# Patient Record
Sex: Female | Born: 1990 | Race: Black or African American | Hispanic: No | Marital: Single | Smoking: Never smoker
Health system: Southern US, Community
[De-identification: ages and names within clinical notes are randomized; demographics above are authoritative.]

## PROBLEM LIST (undated history)

## (undated) DIAGNOSIS — Z789 Other specified health status: Secondary | ICD-10-CM

## (undated) DIAGNOSIS — D649 Anemia, unspecified: Secondary | ICD-10-CM

## (undated) DIAGNOSIS — Z8759 Personal history of other complications of pregnancy, childbirth and the puerperium: Secondary | ICD-10-CM

## (undated) DIAGNOSIS — N883 Incompetence of cervix uteri: Secondary | ICD-10-CM

## (undated) HISTORY — DX: Anemia, unspecified: D64.9

## (undated) HISTORY — PX: TONSILLECTOMY: SUR1361

## (undated) HISTORY — PX: OTHER SURGICAL HISTORY: SHX169

## (undated) HISTORY — PX: NO PAST SURGERIES: SHX2092

---

## 2016-01-28 ENCOUNTER — Encounter (HOSPITAL_COMMUNITY): Payer: Self-pay

## 2016-01-28 ENCOUNTER — Inpatient Hospital Stay (HOSPITAL_COMMUNITY)
Admission: AD | Admit: 2016-01-28 | Discharge: 2016-01-28 | Disposition: A | Discharge status: Home / Self Care | Payer: Self-pay | Source: Ambulatory Visit | Attending: Family Medicine | Admitting: Family Medicine

## 2016-01-28 DIAGNOSIS — N920 Excessive and frequent menstruation with regular cycle: Secondary | ICD-10-CM | POA: Insufficient documentation

## 2016-01-28 HISTORY — DX: Other specified health status: Z78.9

## 2016-01-28 LAB — URINE MICROSCOPIC-ADD ON: Bacteria, UA: NONE SEEN

## 2016-01-28 LAB — CBC
HEMATOCRIT: 33.6 % — AB (ref 36.0–46.0)
HEMOGLOBIN: 11.6 g/dL — AB (ref 12.0–15.0)
MCH: 28.8 pg (ref 26.0–34.0)
MCHC: 34.5 g/dL (ref 30.0–36.0)
MCV: 83.4 fL (ref 78.0–100.0)
Platelets: 261 10*3/uL (ref 150–400)
RBC: 4.03 MIL/uL (ref 3.87–5.11)
RDW: 12.3 % (ref 11.5–15.5)
WBC: 8.6 10*3/uL (ref 4.0–10.5)

## 2016-01-28 LAB — URINALYSIS, ROUTINE W REFLEX MICROSCOPIC
Bilirubin Urine: NEGATIVE
GLUCOSE, UA: NEGATIVE mg/dL
Ketones, ur: NEGATIVE mg/dL
Nitrite: NEGATIVE
PH: 6.5 (ref 5.0–8.0)
PROTEIN: NEGATIVE mg/dL
Specific Gravity, Urine: 1.01 (ref 1.005–1.030)

## 2016-01-28 LAB — POCT PREGNANCY, URINE: Preg Test, Ur: NEGATIVE

## 2016-01-28 MED ORDER — FERROUS SULFATE 324 (65 FE) MG PO TBEC: 1.0000 | DELAYED_RELEASE_TABLET | Freq: Two times a day (BID) | ORAL | 3 refills | Status: DC

## 2016-01-28 NOTE — MAU Note (Signed)
Pt states she is having her period, passed large clot yesterday, has changed 3 pads today.  Stopped depo 6-8 months ago.  Had sharp pain in lower abd earlier today, none now.  Also had back pain earlier but not now.

## 2016-01-28 NOTE — MAU Provider Note (Signed)
History     CSN: [id number]  Arrival date and time: 01/28/16 1545   First Provider Initiated Contact with Patient 01/28/16 1727      Chief Complaint  Patient presents with  . Vaginal Bleeding   HPI  Patient is a 25 year old female who presents today for evaluation of heavy periods. Patient reports she was on Depo-Provera until about 1 year ago. Ever since she's had significantly heavy periods with passage of clots. She presents today could she was concerned however passing approximately a baseball size clot yesterday. Since since then she is only passed very small clots. She does report lightheadedness and dizziness especially with changes in position but this is been going on for some time for her. Additionally she does endorse some mild palpitations. She is sexually active with her boyfriend in a monogamous relationship and they do not regularly use protection or birth control of any type.    Past Medical History:  Diagnosis Date  . Medical history non-contributory     Past Surgical History:  Procedure Laterality Date  . NO PAST SURGERIES      Family History  Problem Relation Age of Onset  . Diabetes Mother     Social History  Substance Use Topics  . Smoking status: Never Smoker  . Smokeless tobacco: Never Used  . Alcohol use Yes     Comment: social    Allergies: No Known Allergies  No prescriptions prior to admission.    Review of Systems  Constitutional: Negative for chills, fever and weight loss.  HENT: Negative for congestion.   Eyes: Negative for blurred vision and double vision.  Respiratory: Negative for cough and hemoptysis.   Cardiovascular: Positive for palpitations. Negative for chest pain.  Gastrointestinal: Positive for abdominal pain. Negative for heartburn, nausea and vomiting.  Genitourinary: Negative for dysuria, frequency and urgency.  Neurological: Positive for dizziness. Negative for sensory change, loss of consciousness and headaches.   Endo/Heme/Allergies: Negative for environmental allergies. Does not bruise/bleed easily.   Physical Exam   Blood pressure 119/81, pulse 104, temperature 98.4 F (36.9 C), temperature source Oral, resp. rate 18, height 5\' 2"  (1.575 m), weight 118 lb (53.5 kg), last menstrual period 01/25/2016.  Physical Exam  Constitutional: She is oriented to person, place, and time. She appears well-developed and well-nourished.  HENT:  Head: Normocephalic and atraumatic.  Cardiovascular: Normal rate, regular rhythm and normal heart sounds.   Respiratory: Effort normal and breath sounds normal. No respiratory distress.  GI: Soft. Bowel sounds are normal. She exhibits no distension. There is no tenderness.  Genitourinary:  Genitourinary Comments: Minimal bright red blood in the vaginal vault, no cervical motion tenderness, cervix closed visually  Neurological: She is alert and oriented to person, place, and time.  Skin: Skin is warm and dry.  Psychiatric: She has a normal mood and affect. Her behavior is normal.    MAU Course  Procedures  MDM  In the MAU patient underwent pelvic exam which is unremarkable aside from some scant blood, additionally patient had a CBC drawn which revealed minimal anemia. We will prescribe a iron tablet. Otherwise patient was counseled that likely this is just a heavy. And the best way to control this would be birth control which she declines at this time. He appears clinically stable at this time with no hypo-tension or tachycardia  Assessment and Plan  Menorrhagia: Patient with heavy periods. Will supplement with iron is patient is becoming slightly anemic and slightly microcytic. Patient does not wish to  start birth control to control her periods at this time. Patient is reassured and ready for discharge.  Ernestina Pennaicholas Ijeoma Loor 01/28/2016, 5:27 PM

## 2016-01-28 NOTE — Discharge Instructions (Signed)

## 2016-01-29 LAB — CULTURE, OB URINE

## 2017-01-13 ENCOUNTER — Encounter: Payer: Self-pay | Admitting: Obstetrics and Gynecology

## 2017-01-13 ENCOUNTER — Ambulatory Visit (INDEPENDENT_AMBULATORY_CARE_PROVIDER_SITE_OTHER): Payer: BLUE CROSS/BLUE SHIELD | Admitting: Obstetrics and Gynecology

## 2017-01-13 VITALS — BP 114/70 | Ht 61.0 in | Wt 123.0 lb

## 2017-01-13 DIAGNOSIS — Z1389 Encounter for screening for other disorder: Secondary | ICD-10-CM | POA: Diagnosis not present

## 2017-01-13 DIAGNOSIS — Z1339 Encounter for screening examination for other mental health and behavioral disorders: Secondary | ICD-10-CM

## 2017-01-13 DIAGNOSIS — Z01419 Encounter for gynecological examination (general) (routine) without abnormal findings: Secondary | ICD-10-CM | POA: Diagnosis not present

## 2017-01-13 DIAGNOSIS — Z1331 Encounter for screening for depression: Secondary | ICD-10-CM

## 2017-01-13 DIAGNOSIS — Z124 Encounter for screening for malignant neoplasm of cervix: Secondary | ICD-10-CM

## 2017-01-13 DIAGNOSIS — Z113 Encounter for screening for infections with a predominantly sexual mode of transmission: Secondary | ICD-10-CM

## 2017-01-13 DIAGNOSIS — R875 Abnormal microbiological findings in specimens from female genital organs: Secondary | ICD-10-CM | POA: Diagnosis not present

## 2017-01-13 NOTE — Progress Notes (Signed)
Gynecology Annual Exam  PCP: Patient, No Pcp Per  Chief Complaint  Patient presents with  . Annual Exam    History of Present Illness:  [name] is a 26 y.o. G0P0000 who LMP was Patient's last menstrual period was 01/04/2017., presents today for her annual examination.  Her menses are regular every 28-30 days, lasting 5 day(s).  Dysmenorrhea none. She does not have intermenstrual bleeding.  She is single partner, contraception - none.  Last Pap: 2 years  Results were: no abnormalities /neg HPV DNA not done Hx of STDs: none  There is no FH of breast cancer. There is no FH of ovarian cancer. The patient does not do self-breast exams.  Tobacco use: The patient denies current or previous tobacco use. Alcohol use: social drinker Exercise: moderately active  The patient wears seatbelts: yes.   The patient reports that domestic violence in her life is absent.   Past Medical History:  Diagnosis Date  . Medical history non-contributory     Past Surgical History:  Procedure Laterality Date  . NO PAST SURGERIES      Prior to Admission medications   Medication Sig Start Date End Date Taking? Authorizing Provider  ferrous sulfate 324 (65 Fe) MG TBEC Take 1 tablet (325 mg total) by mouth 2 (two) times daily. 01/28/16  Yes Lorne SkeensSchenk, Nicholas Michael, MD   Allergies: No Known Allergies  Gynecologic History: Patient's last menstrual period was 01/04/2017. History of abnormal pap smear: No History of STI: No   Obstetric History: G0P0000  Social History   Social History  . Marital status: Single    Spouse name: N/A  . Number of children: N/A  . Years of education: N/A   Occupational History  . Not on file.   Social History Main Topics  . Smoking status: Never Smoker  . Smokeless tobacco: Never Used  . Alcohol use Yes     Comment: social  . Drug use: No  . Sexual activity: Yes    Birth control/ protection: None   Other Topics Concern  . Not on file   Social  History Narrative  . No narrative on file    Family History  Problem Relation Age of Onset  . Diabetes Mother     ROS   Physical Exam BP 114/70   Ht 5\' 1"  (1.549 m)   Wt 123 lb (55.8 kg)   LMP 01/04/2017   BMI 23.24 kg/m    OBGyn Exam  Female chaperone present for pelvic and breast  portions of the physical exam  Results: AUDIT Questionnaire (screen for alcoholism): 2 PHQ-9: 2   Assessment: 26 y.o. G0P0000 female here for routine annual gynecologic examination  Plan: Problem List Items Addressed This Visit    None    Visit Diagnoses    Women's annual routine gynecological examination    -  Primary   Relevant Orders   IGP,CtNgTv,rfx Aptima HPV ASCU   Pap smear for cervical cancer screening       Relevant Orders   IGP,CtNgTv,rfx Aptima HPV ASCU   Screen for STD (sexually transmitted disease)       Relevant Orders   IGP,CtNgTv,rfx Aptima HPV ASCU   Screening for depression       Screening for alcohol problem          Screening: -- Blood pressure screen normal -- Weight screening: normal -- Depression screening negative (PHQ-9) -- Nutrition: normal -- cholesterol screening: not due for screening -- osteoporosis screening: not due --  tobacco screening: not using -- alcohol screening: AUDIT questionnaire indicates low-risk usage. -- family history of breast cancer screening: done. not at high risk. -- no evidence of domestic violence or intimate partner violence. -- STD screening: gonorrhea/chlamydia NAAT collected -- pap smear collected per ASCCP guidelines  [name], MD 01/13/2017 3:59 PM

## 2017-01-26 LAB — IGP, CTNG, RFX APTIMA HPV ASCU
CHLAMYDIA, NUC. ACID AMP: NEGATIVE
Gonococcus by Nucleic Acid Amp: NEGATIVE
PAP SMEAR COMMENT: 0

## 2017-02-06 ENCOUNTER — Telehealth: Payer: Self-pay

## 2017-02-06 DIAGNOSIS — A5901 Trichomonal vulvovaginitis: Secondary | ICD-10-CM

## 2017-02-06 NOTE — Telephone Encounter (Signed)
Pt aware and will p/u RX when sent in

## 2017-02-06 NOTE — Telephone Encounter (Signed)
-----   Message from Conard NovakStephen D Jackson, MD sent at 02/05/2017 10:25 AM EDT ----- Franchot MimesHi Beverly,  Would you mind letting this patient know that her pap smear came back as normal. However, it showed trichomonas, even though we weren't looking for that.   Once you let her know, I will call in her prescription for trichomonas.  Be sure to let her know that her partner needs to be treated, as well.  If you have any questions, let me know.  Please send this back to me once you've talked to her so that I can go ahead and send in her prescription. Thank you!

## 2017-02-06 NOTE — Telephone Encounter (Signed)
Pt is calling about her prescription. Pt states isn't at pharmacy. Please advise and call patient.

## 2017-02-07 MED ORDER — METRONIDAZOLE 500 MG PO TABS: 2000.0000 mg | ORAL_TABLET | Freq: Once | ORAL | 0 refills | Status: AC

## 2017-02-07 NOTE — Telephone Encounter (Signed)
[facility] [address]

## 2017-02-17 ENCOUNTER — Telehealth: Payer: Self-pay

## 2017-02-17 NOTE — Telephone Encounter (Signed)
Pt states she needs another RX sent in for treatment of trich to give to her partner as he does not have a doctor. fwding to [facility]. I will let pt know when sent.

## 2017-02-17 NOTE — Telephone Encounter (Signed)
Patient calling back due to find out about her prescription please advise

## 2017-02-18 ENCOUNTER — Other Ambulatory Visit: Payer: Self-pay

## 2017-02-18 ENCOUNTER — Telehealth: Payer: Self-pay | Admitting: Obstetrics and Gynecology

## 2017-02-18 MED ORDER — FERROUS SULFATE 324 (65 FE) MG PO TBEC: 1.0000 | DELAYED_RELEASE_TABLET | Freq: Two times a day (BID) | ORAL | 3 refills | Status: DC

## 2017-02-18 NOTE — Telephone Encounter (Signed)
Refill sent in to pharm. Tried to call pt with no answer. Please let her know when she calls back

## 2017-02-18 NOTE — Telephone Encounter (Signed)
You may send her in another prescription for flagyl 500 mg tabs. Take 4 tablets (total of 2,000 mg) at one time. No refills.

## 2017-02-18 NOTE — Telephone Encounter (Signed)
See previous task. Assigned to [facility].

## 2017-02-18 NOTE — Telephone Encounter (Signed)
Pt is requesting an Rx for  metroNIDAZOLE (FLAGYL) 500 MG tablet to be sent [facility] [address] for her partner b/c he doesn't have a PCP. Pt stated that Dr. Jean RosenthalJackson told her at her last OV that if her partner needed the medication to call the office and he would send in a Rx. Pt stated she has been requesting this since 02/17/17. Please advise. Thanks TNP

## 2017-02-19 ENCOUNTER — Encounter: Payer: Self-pay | Admitting: Obstetrics and Gynecology

## 2017-03-24 ENCOUNTER — Ambulatory Visit (INDEPENDENT_AMBULATORY_CARE_PROVIDER_SITE_OTHER): Payer: BLUE CROSS/BLUE SHIELD | Admitting: Maternal Newborn

## 2017-03-24 ENCOUNTER — Encounter: Payer: Self-pay | Admitting: Maternal Newborn

## 2017-03-24 VITALS — BP 114/70 | Wt 129.0 lb

## 2017-03-24 DIAGNOSIS — Z3401 Encounter for supervision of normal first pregnancy, first trimester: Secondary | ICD-10-CM | POA: Diagnosis not present

## 2017-03-24 NOTE — Progress Notes (Addendum)
03/25/2017   Chief Complaint: Amenorrhea, positive home pregnancy test, desires prenatal care.  Transfer of Care Patient: no  History of Present Illness: Michelle Miller is a 26 y.o. G1P0000 at 5w 1d based on last menstrual period on 02/17/2017, with an Estimated Date of Delivery: 11/24/2017, with the above CC.   Her periods were: regular periods every 28 days, lasting 5-6 days. Her cycle in September was 3-4 days. She was using no method when she conceived.  She has Positive signs or symptoms of fatigue and breast tenderness She has Negative signs or symptoms of miscarriage or preterm labor She identifies Negative Zika risk factors for her and her partner On any different medications around the time she conceived/early pregnancy: No  History of varicella: No   ROS: A 12-point review of systems was performed and negative, except as stated in the above HPI.  OBGYN History: As per HPI. OB History  Gravida Para Term Preterm AB Living  1 0 0 0 0 0  SAB TAB Ectopic Multiple Live Births  0 0 0 0 0    # Outcome Date GA Lbr Len/2nd Weight Sex Delivery Anes PTL Lv  1 Current               Any issues with any prior pregnancies: not applicable Any prior children are healthy, doing well, without any problems or issues: not applicable History of pap smears: 01/13/2017. Result: NIL. History of STIs: Trichomoniasis in 01/2017, treated.   Past Medical History: Past Medical History:  Diagnosis Date  . Medical history non-contributory     Past Surgical History: Past Surgical History:  Procedure Laterality Date  . NO PAST SURGERIES      Family History:  Family History  Problem Relation Age of Onset  . Diabetes Mother    She denies any female cancers, bleeding or blood clotting disorders.  She denies any history of intellectual disability, birth defects or genetic disorders in her or the FOB's history  Social History:  Social History   Social History  . Marital status: Single   Spouse name: N/A  . Number of children: N/A  . Years of education: N/A   Occupational History  . Not on file.   Social History Main Topics  . Smoking status: Never Smoker  . Smokeless tobacco: Never Used  . Alcohol use Yes     Comment: social  . Drug use: No  . Sexual activity: Yes    Birth control/ protection: None   Other Topics Concern  . Not on file   Social History Narrative  . No narrative on file   Any cats in the household: no Denies history of and current domestic violence.  Allergy: No Known Allergies  Current Outpatient Medications:  Current Outpatient Prescriptions:  .  ferrous sulfate 324 (65 Fe) MG TBEC, Take 1 tablet (325 mg total) by mouth 2 (two) times daily. (Patient not taking: Reported on 03/24/2017), Disp: 30 tablet, Rfl: 3   Physical Exam:   BP 114/70   Wt 129 lb (58.5 kg)   LMP 02/17/2017   BMI 24.37 kg/m  Body mass index is 24.37 kg/m. Constitutional: Well nourished, well developed female in no acute distress.  Neck:  Supple, normal appearance, and no thyromegaly  Cardiovascular: S1, S2 normal, no murmur, rub or gallop, regular rate and rhythm Respiratory:  Clear to auscultation bilateral. Normal respiratory effort Abdomen: positive bowel sounds and no masses, hernias; diffusely non tender to palpation, non distended Breasts: breasts appear normal,  no suspicious masses, no skin or nipple changes or axillary nodes. Neuro/Psych:  Normal mood and affect.  Skin:  Warm and dry.  Lymphatic:  No inguinal lymphadenopathy.   Pelvic exam: is limited by body habitus External genitalia, Bartholin's glands, Urethra, Skene's glands: within normal limits Vagina: within normal limits and with no blood in the vault  Cervix: normal appearing cervix without discharge or lesions, closed/long/high Uterus:  enlarged: consistent with early pregnancy Adnexa:  no mass, fullness, tenderness  Assessment: Ms. Degraffenreid is a 26 y.o. G1P0000 Unknown based on  Patient's last menstrual period was 02/17/2017. with an Estimated Date of Delivery: None noted., presenting for prenatal care.  Plan:  1) Avoid alcoholic beverages. 2) Patient encouraged not to smoke.  3) Discontinue the use of all non-medicinal drugs and chemicals.  4) Take prenatal vitamins daily.  5) Seatbelt use advised 6) Nutrition, food safety (fish, cheese advisories, and high nitrite foods) and exercise discussed. 7) Hospital and practice style delivering at [facility] discussed  8) Patient is asked about travel to areas at risk for the Zika virus, and counseled to avoid travel and exposure to mosquitoes or sexual partners who may have themselves been exposed to the virus. Testing is discussed, and will be ordered as appropriate.  9) Childbirth classes at [facility] advised 10) Genetic Screening, such as with 1st Trimester Screening, cell free fetal DNA, AFP testing, and Ultrasound, as well as with amniocentesis and CVS as appropriate, is discussed with patient. She plans to have genetic testing this pregnancy.  Problem list reviewed and updated.  Return in about 1 day (around 03/25/2017) for ROB following ultrasound.  Marcelyn Bruins, CNM [facility], [facility] 03/25/2017  5:26 PM

## 2017-03-24 NOTE — Patient Instructions (Signed)
First Trimester of Pregnancy The first trimester of pregnancy is from week 1 until the end of week 13 (months 1 through 3). A week after a sperm fertilizes an egg, the egg will implant on the wall of the uterus. This embryo will begin to develop into a baby. Genes from you and your partner will form the baby. The female genes will determine whether the baby will be a boy or a girl. At 6-8 weeks, the eyes and face will be formed, and the heartbeat can be seen on ultrasound. At the end of 12 weeks, all the baby's organs will be formed. Now that you are pregnant, you will want to do everything you can to have a healthy baby. Two of the most important things are to get good prenatal care and to follow your health care provider's instructions. Prenatal care is all the medical care you receive before the baby's birth. This care will help prevent, find, and treat any problems during the pregnancy and childbirth. Body changes during your first trimester Your body goes through many changes during pregnancy. The changes vary from woman to woman.  You may gain or lose a couple of pounds at first.  You may feel sick to your stomach (nauseous) and you may throw up (vomit). If the vomiting is uncontrollable, call your health care provider.  You may tire easily.  You may develop headaches that can be relieved by medicines. All medicines should be approved by your health care provider.  You may urinate more often. Painful urination may mean you have a bladder infection.  You may develop heartburn as a result of your pregnancy.  You may develop constipation because certain hormones are causing the muscles that push stool through your intestines to slow down.  You may develop hemorrhoids or swollen veins (varicose veins).  Your breasts may begin to grow larger and become tender. Your nipples may stick out more, and the tissue that surrounds them (areola) may become darker.  Your gums may bleed and may be  sensitive to brushing and flossing.  Dark spots or blotches (chloasma, mask of pregnancy) may develop on your face. This will likely fade after the baby is born.  Your menstrual periods will stop.  You may have a loss of appetite.  You may develop cravings for certain kinds of food.  You may have changes in your emotions from day to day, such as being excited to be pregnant or being concerned that something may go wrong with the pregnancy and baby.  You may have more vivid and strange dreams.  You may have changes in your hair. These can include thickening of your hair, rapid growth, and changes in texture. Some women also have hair loss during or after pregnancy, or hair that feels dry or thin. Your hair will most likely return to normal after your baby is born.  What to expect at prenatal visits During a routine prenatal visit:  You will be weighed to make sure you and the baby are growing normally.  Your blood pressure will be taken.  Your abdomen will be measured to track your baby's growth.  The fetal heartbeat will be listened to between weeks 10 and 14 of your pregnancy.  Test results from any previous visits will be discussed.  Your health care provider may ask you:  How you are feeling.  If you are feeling the baby move.  If you have had any abnormal symptoms, such as leaking fluid, bleeding, severe headaches,   or abdominal cramping.  If you are using any tobacco products, including cigarettes, chewing tobacco, and electronic cigarettes.  If you have any questions.  Other tests that may be performed during your first trimester include:  Blood tests to find your blood type and to check for the presence of any previous infections. The tests will also be used to check for low iron levels (anemia) and protein on red blood cells (Rh antibodies). Depending on your risk factors, or if you previously had diabetes during pregnancy, you may have tests to check for high blood  sugar that affects pregnant women (gestational diabetes).  Urine tests to check for infections, diabetes, or protein in the urine.  An ultrasound to confirm the proper growth and development of the baby.  Fetal screens for spinal cord problems (spina bifida) and Down syndrome.  HIV (human immunodeficiency virus) testing. Routine prenatal testing includes screening for HIV, unless you choose not to have this test.  You may need other tests to make sure you and the baby are doing well.  Follow these instructions at home: Medicines  Follow your health care provider's instructions regarding medicine use. Specific medicines may be either safe or unsafe to take during pregnancy.  Take a prenatal vitamin that contains at least 600 micrograms (mcg) of folic acid.  If you develop constipation, try taking a stool softener if your health care provider approves. Eating and drinking  Eat a balanced diet that includes fresh fruits and vegetables, whole grains, good sources of protein such as meat, eggs, or tofu, and low-fat dairy. Your health care provider will help you determine the amount of weight gain that is right for you.  Avoid raw meat and uncooked cheese. These carry germs that can cause birth defects in the baby.  Eating four or five small meals rather than three large meals a day may help relieve nausea and vomiting. If you start to feel nauseous, eating a few soda crackers can be helpful. Drinking liquids between meals, instead of during meals, also seems to help ease nausea and vomiting.  Limit foods that are high in fat and processed sugars, such as fried and sweet foods.  To prevent constipation: ? Eat foods that are high in fiber, such as fresh fruits and vegetables, whole grains, and beans. ? Drink enough fluid to keep your urine clear or pale yellow. Activity  Exercise only as directed by your health care provider. Most women can continue their usual exercise routine during  pregnancy. Try to exercise for 30 minutes at least 5 days a week. Exercising will help you: ? Control your weight. ? Stay in shape. ? Be prepared for labor and delivery.  Experiencing pain or cramping in the lower abdomen or lower back is a good sign that you should stop exercising. Check with your health care provider before continuing with normal exercises.  Try to avoid standing for long periods of time. Move your legs often if you must stand in one place for a long time.  Avoid heavy lifting.  Wear low-heeled shoes and practice good posture.  You may continue to have sex unless your health care provider tells you not to. Relieving pain and discomfort  Wear a good support bra to relieve breast tenderness.  Take warm sitz baths to soothe any pain or discomfort caused by hemorrhoids. Use hemorrhoid cream if your health care provider approves.  Rest with your legs elevated if you have leg cramps or low back pain.  If you develop   varicose veins in your legs, wear support hose. Elevate your feet for 15 minutes, 3-4 times a day. Limit salt in your diet. Prenatal care  Schedule your prenatal visits by the twelfth week of pregnancy. They are usually scheduled monthly at first, then more often in the last 2 months before delivery.  Write down your questions. Take them to your prenatal visits.  Keep all your prenatal visits as told by your health care provider. This is important. Safety  Wear your seat belt at all times when driving.  Make a list of emergency phone numbers, including numbers for family, friends, the hospital, and police and fire departments. General instructions  Ask your health care provider for a referral to a local prenatal education class. Begin classes no later than the beginning of month 6 of your pregnancy.  Ask for help if you have counseling or nutritional needs during pregnancy. Your health care provider can offer advice or refer you to specialists for help  with various needs.  Do not use hot tubs, steam rooms, or saunas.  Do not douche or use tampons or scented sanitary pads.  Do not cross your legs for long periods of time.  Avoid cat litter boxes and soil used by cats. These carry germs that can cause birth defects in the baby and possibly loss of the fetus by miscarriage or stillbirth.  Avoid all smoking, herbs, alcohol, and medicines not prescribed by your health care provider. Chemicals in these products affect the formation and growth of the baby.  Do not use any products that contain nicotine or tobacco, such as cigarettes and e-cigarettes. If you need help quitting, ask your health care provider. You may receive counseling support and other resources to help you quit.  Schedule a dentist appointment. At home, brush your teeth with a soft toothbrush and be gentle when you floss. Contact a health care provider if:  You have dizziness.  You have mild pelvic cramps, pelvic pressure, or nagging pain in the abdominal area.  You have persistent nausea, vomiting, or diarrhea.  You have a bad smelling vaginal discharge.  You have pain when you urinate.  You notice increased swelling in your face, hands, legs, or ankles.  You are exposed to fifth disease or chickenpox.  You are exposed to German measles (rubella) and have never had it. Get help right away if:  You have a fever.  You are leaking fluid from your vagina.  You have spotting or bleeding from your vagina.  You have severe abdominal cramping or pain.  You have rapid weight gain or loss.  You vomit blood or material that looks like coffee grounds.  You develop a severe headache.  You have shortness of breath.  You have any kind of trauma, such as from a fall or a car accident. Summary  The first trimester of pregnancy is from week 1 until the end of week 13 (months 1 through 3).  Your body goes through many changes during pregnancy. The changes vary from  woman to woman.  You will have routine prenatal visits. During those visits, your health care provider will examine you, discuss any test results you may have, and talk with you about how you are feeling. This information is not intended to replace advice given to you by your health care provider. Make sure you discuss any questions you have with your health care provider. Document Released: 05/20/2001 Document Revised: 05/07/2016 Document Reviewed: 05/07/2016 Elsevier Interactive Patient Education  2017 [employer]   Inc[employer]  

## 2017-03-25 LAB — RPR+RH+ABO+RUB AB+AB SCR+CB...
ANTIBODY SCREEN: NEGATIVE
HEMATOCRIT: 37.1 % (ref 34.0–46.6)
HIV SCREEN 4TH GENERATION: NONREACTIVE
Hemoglobin: 12.3 g/dL (ref 11.1–15.9)
Hepatitis B Surface Ag: NEGATIVE
MCH: 29.2 pg (ref 26.6–33.0)
MCHC: 33.2 g/dL (ref 31.5–35.7)
MCV: 88 fL (ref 79–97)
PLATELETS: 327 10*3/uL (ref 150–379)
RBC: 4.21 x10E6/uL (ref 3.77–5.28)
RDW: 13 % (ref 12.3–15.4)
RH TYPE: POSITIVE
RPR Ser Ql: NONREACTIVE
Rubella Antibodies, IGG: 8.9 index (ref >0.99)
Varicella zoster IgG: 1762 index (ref >165)
WBC: 8.2 10*3/uL (ref 3.4–10.8)

## 2017-03-26 ENCOUNTER — Ambulatory Visit (INDEPENDENT_AMBULATORY_CARE_PROVIDER_SITE_OTHER): Payer: BLUE CROSS/BLUE SHIELD | Admitting: Obstetrics and Gynecology

## 2017-03-26 ENCOUNTER — Ambulatory Visit (INDEPENDENT_AMBULATORY_CARE_PROVIDER_SITE_OTHER): Payer: BLUE CROSS/BLUE SHIELD

## 2017-03-26 VITALS — BP 118/76 | Wt 132.0 lb

## 2017-03-26 DIAGNOSIS — Z3401 Encounter for supervision of normal first pregnancy, first trimester: Secondary | ICD-10-CM

## 2017-03-26 DIAGNOSIS — Z3A01 Less than 8 weeks gestation of pregnancy: Secondary | ICD-10-CM

## 2017-03-26 LAB — URINE DRUG PANEL 7
AMPHETAMINES, URINE: NEGATIVE ng/mL
BARBITURATE QUANT UR: NEGATIVE ng/mL
BENZODIAZEPINE QUANT UR: NEGATIVE ng/mL
COCAINE (METAB.): NEGATIVE ng/mL
Cannabinoid Quant, Ur: NEGATIVE ng/mL
Opiate Quant, Ur: NEGATIVE ng/mL
PCP Quant, Ur: NEGATIVE ng/mL

## 2017-03-26 LAB — GC/CHLAMYDIA PROBE AMP
Chlamydia trachomatis, NAA: NEGATIVE
Neisseria gonorrhoeae by PCR: NEGATIVE

## 2017-03-26 LAB — URINE CULTURE

## 2017-03-26 NOTE — Progress Notes (Signed)
  Routine Prenatal Care Visit  Subjective  Michelle Miller is a 26 y.o. G1P0000 at 39665w2d being seen today for ongoing prenatal care.  She is currently monitored for the following issues for this low-risk pregnancy and has Encounter for supervision of normal first pregnancy in first trimester on her problem list.  ----------------------------------------------------------------------------------- Patient reports no complaints.    . Vag. Bleeding: None.   . Denies leaking of fluid.  U/S shows gestational sac with yolk sac. See report below.  ----------------------------------------------------------------------------------- The following portions of the patient's history were reviewed and updated as appropriate: allergies, current medications, past family history, past medical history, past social history, past surgical history and problem list. Problem list updated.   Objective  Blood pressure 118/76, weight 132 lb (59.9 kg), last menstrual period 02/17/2017. Pregravid weight Pregravid weight not on file Total Weight Gain Not found. Urinalysis: Urine Protein: Negative Urine Glucose: Negative  Fetal Status:           General:  Alert, oriented and cooperative. Patient is in no acute distress.  Skin: Skin is warm and dry. No rash noted.   Cardiovascular: Normal heart rate noted  Respiratory: Normal respiratory effort, no problems with respiration noted  Abdomen: Soft, gravid, appropriate for gestational age. Pain/Pressure: Absent     Pelvic:  Cervical exam deferred        Extremities: Normal range of motion.     Mental Status: Normal mood and affect. Normal behavior. Normal judgment and thought content.   Assessment   26 y.o. G1P0000 at 44665w2d by  11/24/2017, by Last Menstrual Period presenting for routine prenatal visit  Plan   pregnancy Problems (from 03/24/17 to present)    Problem Noted Resolved   Encounter for supervision of normal first pregnancy in first trimester 03/24/2017 by  Oswaldo ConroySchmid, Jacelyn Y, CNM No   Overview Addendum 03/26/2017  5:40 PM by Conard NovakJackson, Stephen D, MD    Clinic [facility] Prenatal Labs  Dating pending Blood type: AB/Positive/-- (10/16 1123)   Genetic Screen 1 Screen:    AFP:     Quad:     NIPS: Antibody:Negative (10/16 1123)  Anatomic US  Rubella: 8.90 (10/16 1123) Varicella:    GTT Early:               Third trimester:  RPR: Non Reactive (10/16 1123)   Rhogam  HBsAg: Negative (10/16 1123)   TDaP vaccine                       Flu Shot: HIV:     Baby Food                                GBS:   Contraception  Pap:  CBB     CS/VBAC    Support Person               Please refer to After Visit Summary for other counseling recommendations. \  Will get follow up ultrasound in 11+ days to verify viable pregnancy. See ACOG PB 200 for recommendations on diagnosing early pregnancy loss.  No concerns at this time. But, follow up should allow for guaranteed answer to question.  Return in about 11 days (around 04/06/2017) for schedule dating u/s and routine prenatal.  Thomasene MohairStephen Jackson, MD  03/26/2017 5:40 PM

## 2017-04-08 ENCOUNTER — Telehealth: Payer: Self-pay

## 2017-04-08 NOTE — Telephone Encounter (Signed)
Pt calling for have rx for [facility] [place].  [phone]  Left detailed for pt to specify [facility] [place].  Adv chart [facility] [place] [facility] [address].  Please verify.

## 2017-04-09 ENCOUNTER — Other Ambulatory Visit: Payer: Self-pay

## 2017-04-09 MED ORDER — PROVIDA OB 20-20-1.25 MG PO CAPS: 1.0000 | ORAL_CAPSULE | Freq: Every day | ORAL | 4 refills | Status: DC

## 2017-04-09 NOTE — Telephone Encounter (Signed)
Pt called triage this morning verifying Rx be sent to [facility] [place] [address]   I resent Rx to correct pharmacy. KJ CMA

## 2017-04-09 NOTE — Telephone Encounter (Signed)
See message from patient on 10/31. Rx sent to right pharmacy today. Pt aware

## 2017-04-20 ENCOUNTER — Ambulatory Visit (INDEPENDENT_AMBULATORY_CARE_PROVIDER_SITE_OTHER): Payer: BLUE CROSS/BLUE SHIELD

## 2017-04-20 ENCOUNTER — Ambulatory Visit (INDEPENDENT_AMBULATORY_CARE_PROVIDER_SITE_OTHER): Payer: BLUE CROSS/BLUE SHIELD | Admitting: Obstetrics and Gynecology

## 2017-04-20 VITALS — BP 120/60 | Wt 130.0 lb

## 2017-04-20 DIAGNOSIS — Z3401 Encounter for supervision of normal first pregnancy, first trimester: Secondary | ICD-10-CM

## 2017-04-20 DIAGNOSIS — Z362 Encounter for other antenatal screening follow-up: Secondary | ICD-10-CM

## 2017-04-20 DIAGNOSIS — Z3A08 8 weeks gestation of pregnancy: Secondary | ICD-10-CM

## 2017-04-20 DIAGNOSIS — Z1379 Encounter for other screening for genetic and chromosomal anomalies: Secondary | ICD-10-CM

## 2017-04-20 NOTE — Progress Notes (Signed)
S=D on ultrasound today 1st Trimester screen next visit Discussed influenza vaccination undecided

## 2017-04-20 NOTE — Progress Notes (Signed)
ROB Dating U/S  Declined Flu shot

## 2017-05-18 ENCOUNTER — Encounter: Payer: BLUE CROSS/BLUE SHIELD | Admitting: Obstetrics and Gynecology

## 2017-05-18 ENCOUNTER — Other Ambulatory Visit: Payer: BLUE CROSS/BLUE SHIELD

## 2017-05-19 ENCOUNTER — Encounter: Payer: Self-pay | Admitting: Maternal Newborn

## 2017-05-19 ENCOUNTER — Ambulatory Visit (INDEPENDENT_AMBULATORY_CARE_PROVIDER_SITE_OTHER): Payer: BLUE CROSS/BLUE SHIELD

## 2017-05-19 ENCOUNTER — Ambulatory Visit (INDEPENDENT_AMBULATORY_CARE_PROVIDER_SITE_OTHER): Payer: BLUE CROSS/BLUE SHIELD | Admitting: Maternal Newborn

## 2017-05-19 VITALS — BP 104/60 | Wt 129.0 lb

## 2017-05-19 DIAGNOSIS — Z1379 Encounter for other screening for genetic and chromosomal anomalies: Secondary | ICD-10-CM

## 2017-05-19 DIAGNOSIS — Z3401 Encounter for supervision of normal first pregnancy, first trimester: Secondary | ICD-10-CM

## 2017-05-19 DIAGNOSIS — Z3682 Encounter for antenatal screening for nuchal translucency: Secondary | ICD-10-CM

## 2017-05-19 DIAGNOSIS — Z3A13 13 weeks gestation of pregnancy: Secondary | ICD-10-CM

## 2017-05-19 NOTE — Patient Instructions (Signed)

## 2017-05-19 NOTE — Progress Notes (Signed)
    Routine Prenatal Care Visit  Subjective  Michelle Miller is a 26 y.o. G1P0000 at 1658w0d being seen today for ongoing prenatal care.  She is currently monitored for the following issues for this low-risk pregnancy and has Encounter for supervision of normal first pregnancy in first trimester on her problem list.  ----------------------------------------------------------------------------------- Patient reports brown spotting for two days last week that has resolved.   Vag. Bleeding: None. Denies leaking of fluid.  ----------------------------------------------------------------------------------- The following portions of the patient's history were reviewed and updated as appropriate: allergies, current medications, past family history, past medical history, past social history, past surgical history and problem list. Problem list updated.  Objective  Last menstrual period 02/17/2017. Pregravid weight 125 lb (56.7 kg) Total Weight Gain 4 lb (1.814 kg) Urinalysis: Urine Protein: Trace Urine Glucose: Negative  Fetal Status: Fetal Heart Rate (bpm): 150         General:  Alert, oriented and cooperative. Patient is in no acute distress.  Skin: Skin is warm and dry. No rash noted.   Cardiovascular: Normal heart rate noted  Respiratory: Normal respiratory effort, no problems with respiration noted  Abdomen: Soft, gravid, appropriate for gestational age. Pain/Pressure: Absent     Pelvic:  Cervical exam deferred        Extremities: Normal range of motion.     Mental Status: Normal mood and affect. Normal behavior. Normal judgment and thought content.     Assessment   26 y.o. G1P0000 at 6658w0d, EDD 11/24/2017 by Ultrasound presenting for routine prenatal visit.  Plan   pregnancy Problems (from 03/24/17 to present)    Problem Noted Resolved   Encounter for supervision of normal first pregnancy in first trimester 03/24/2017 by Oswaldo ConroySchmid, Darcus Edds Y, CNM No   Overview Addendum 03/26/2017  5:40  PM by Conard NovakJackson, Stephen D, MD    [facility]inic [facility] Prenatal Labs  Dating LMP=8w 6d US Blood type: AB/Positive/-- (10/16 1123)   Genetic Screen 1 Screen:    AFP:     Quad:     NIPS: Antibody:Negative (10/16 1123)  Anatomic US  Rubella: 8.90 (10/16 1123) Varicella: Immune    GTT Early:               Third trimester:  RPR: Non Reactive (10/16 1123)   Rhogam  HBsAg: Negative (10/16 1123)   TDaP vaccine                       Flu Shot: HIV: Non Reactive  Baby Food                                GBS:   Contraception  Pap:  CBB     CS/VBAC    Support Person               First trimester screen and NT scan today, single IUP with cardiac activity and size=GA.    Preterm labor symptoms and general obstetric precautions including but not limited to vaginal bleeding, contractions, leaking of fluid and fetal movement were reviewed in detail with the patient.  Please refer to After Visit Summary for other counseling recommendations.   Return in about 4 weeks (around 06/16/2017) for ROB.  Michelle Miller, CNM 05/19/2017  3:31 PM

## 2017-05-19 NOTE — Progress Notes (Signed)
No concerns.rj 

## 2017-05-22 ENCOUNTER — Telehealth: Payer: Self-pay | Admitting: Maternal Newborn

## 2017-05-22 LAB — FIRST TRIMESTER SCREEN W/NT
CRL: 72.3 mm
DIA MOM: 0.72
DIA Value: 178.1 pg/mL
Gest Age-Collect: 13 weeks
Maternal Age At EDD: 24.5 yr
Nuchal Translucency MoM: 0.73
Nuchal Translucency: 1.3 mm
Number of Fetuses: 1
PAPP-A MOM: 1.08
PAPP-A VALUE: 1530.1 ng/mL
Test Results:: NEGATIVE
WEIGHT: 129 [lb_av]
hCG MoM: 1.29
hCG Value: 116.6 IU/mL

## 2017-05-22 NOTE — Telephone Encounter (Signed)
VM to patient informing her of negative first trimester screen.  Marcelyn BruinsJacelyn Schuyler Olden, CNM 05/22/2017  3:01 PM

## 2017-06-09 NOTE — L&D Delivery Note (Addendum)
Obstetrical Delivery Note   Date of Delivery:   11/17/2017 Primary OB:   [facility] Gestational Age/EDD: 7152w0d (Dated by LMP=8wk6d ultrasound) Antepartum complications: none  Delivered By:   Farrel Connersolleen Shekela Goodridge, CNM  Delivery Type:   spontaneous vaginal delivery  Procedure Details:   CTSP as there were moderate to severe variable decelerations with pushing. Station: +2 to +3. Began pushing every other contraction, O2 applied and position changed. Mother pushed on her side to deliver a vigorous female infant. Baby was warm to the touch and had a temperature of 100.4 axillary. Mother afebrile. Baby placed on mother's abdomen and dried. After delayed cord clamping the cord was cut by the maternal grandmother. Baby then placed skin to skin with mother. Spontaneous delivery of intact placenta and 3 vessel cord with trailing membranes. No cervical, vaginal or perineal lacerations were seen. EBL=450 ml Anesthesia:    epidural Intrapartum complications: Variable deceleration second stage GBS:    negative Laceration:    none Episiotomy:    none Placenta:    Via active 3rd stage. To pathology: no Estimated Blood Loss:  450 ml Baby:    Liveborn female, Apgars 9/9, weight pending    Farrel Connersolleen Anthea Udovich, CNM

## 2017-06-17 ENCOUNTER — Encounter: Payer: Self-pay | Admitting: Maternal Newborn

## 2017-06-17 ENCOUNTER — Ambulatory Visit (INDEPENDENT_AMBULATORY_CARE_PROVIDER_SITE_OTHER): Payer: BLUE CROSS/BLUE SHIELD | Admitting: Maternal Newborn

## 2017-06-17 VITALS — BP 100/60 | Wt 133.0 lb

## 2017-06-17 DIAGNOSIS — Z3689 Encounter for other specified antenatal screening: Secondary | ICD-10-CM

## 2017-06-17 DIAGNOSIS — Z3401 Encounter for supervision of normal first pregnancy, first trimester: Secondary | ICD-10-CM

## 2017-06-17 DIAGNOSIS — Z3A17 17 weeks gestation of pregnancy: Secondary | ICD-10-CM

## 2017-06-17 NOTE — Progress Notes (Signed)
    Routine Prenatal Care Visit  Subjective  Michelle Miller is a 27 y.o. G1P0000 at 1523w1d being seen today for ongoing prenatal care.  She is currently monitored for the following issues for this low-risk pregnancy and has Encounter for supervision of normal first pregnancy in first trimester on their problem list.  ----------------------------------------------------------------------------------- Patient reports no complaints.  Has started feeling fetal movement the past couple of days. Contractions: Not present. Vag. Bleeding: None.  Movement: Present. Denies leaking of fluid.  ----------------------------------------------------------------------------------- The following portions of the patient's history were reviewed and updated as appropriate: allergies, current medications, past family history, past medical history, past social history, past surgical history and problem list. Problem list updated.   Objective  Last menstrual period 02/17/2017. Pregravid weight 125 lb (56.7 kg) Total Weight Gain 8 lb (3.629 kg) Urinalysis: Urine Protein: Negative Urine Glucose: Negative  Fetal Status: Fetal Heart Rate (bpm): 144   Movement: Present     General:  Alert, oriented and cooperative. Patient is in no acute distress.  Skin: Skin is warm and dry. No rash noted.   Cardiovascular: Normal heart rate noted  Respiratory: Normal respiratory effort, no problems with respiration noted  Abdomen: Soft, gravid, appropriate for gestational age. Pain/Pressure: Absent     Pelvic:  Cervical exam deferred        Extremities: Normal range of motion.  Edema: None  Mental Status: Normal mood and affect. Normal behavior. Normal judgment and thought content.     Assessment   26 y.o. G1P0000 at 2323w1d, EDD 11/24/2017 by Ultrasound presenting for routine prenatal visit.  Plan   pregnancy Problems (from 03/24/17 to present)    Problem Noted Resolved   Encounter for supervision of normal first pregnancy  in first trimester 03/24/2017 by Oswaldo ConroySchmid, Jacelyn Y, CNM No   Overview Addendum 05/19/2017  3:37 PM by Oswaldo ConroySchmid, Jacelyn Y, CNM    [facility]inic [facility] Prenatal Labs  Dating LMP=8w 6d US Blood type: AB/Positive/-- (10/16 1123)   Genetic Screen 1 Screen:     NIPS: Antibody:Negative (10/16 1123)  Anatomic US  Rubella: 8.90 (10/16 1123) Varicella: Immune  GTT Early:               Third trimester:  RPR: Non Reactive (10/16 1123)   Rhogam  HBsAg: Negative (10/16 1123)   TDaP vaccine                       Flu Shot: HIV: Non Reactive  Baby Food                                GBS:   Contraception  Pap:  CBB     CS/VBAC    Support Person               Discussed that anatomy US will be next visit and she can return in 3 weeks if desired. Her appetite is getting better, advised high calorie snacks and eating more often throughout the day.  Preterm labor symptoms and general obstetric precautions including but not limited to vaginal bleeding, contractions, leaking of fluid and fetal movement were reviewed in detail with the patient.  Return in about 4 weeks (around 07/15/2017) for ROB and anatomy ultrasound.  Marcelyn BruinsJacelyn Schmid, CNM 06/17/2017  2:28 PM

## 2017-06-17 NOTE — Progress Notes (Signed)
C/o concerned c weight.rj

## 2017-06-22 ENCOUNTER — Encounter: Payer: BLUE CROSS/BLUE SHIELD | Admitting: Obstetrics and Gynecology

## 2017-07-16 ENCOUNTER — Ambulatory Visit (INDEPENDENT_AMBULATORY_CARE_PROVIDER_SITE_OTHER): Payer: BLUE CROSS/BLUE SHIELD

## 2017-07-16 ENCOUNTER — Ambulatory Visit (INDEPENDENT_AMBULATORY_CARE_PROVIDER_SITE_OTHER): Payer: BLUE CROSS/BLUE SHIELD | Admitting: Obstetrics & Gynecology

## 2017-07-16 VITALS — BP 120/60 | Wt 139.0 lb

## 2017-07-16 DIAGNOSIS — Z3689 Encounter for other specified antenatal screening: Secondary | ICD-10-CM | POA: Diagnosis not present

## 2017-07-16 DIAGNOSIS — Z3401 Encounter for supervision of normal first pregnancy, first trimester: Secondary | ICD-10-CM

## 2017-07-16 DIAGNOSIS — Z3A21 21 weeks gestation of pregnancy: Secondary | ICD-10-CM

## 2017-07-16 NOTE — Patient Instructions (Signed)

## 2017-07-16 NOTE — Progress Notes (Signed)
  Subjective  Fetal Movement? yes Contractions? no Leaking Fluid? no Vaginal Bleeding? no  Objective  BP 120/60   Wt 139 lb (63 kg)   LMP 02/17/2017   BMI 26.26 kg/m  General: NAD Pumonary: no increased work of breathing Abdomen: gravid, non-tender Extremities: no edema Psychiatric: mood appropriate, affect full  Assessment  27 y.o. G1P0000 at 9110w2d by  11/24/2017, by Ultrasound presenting for routine prenatal visit  Plan   Problem List Items Addressed This Visit      Other   Encounter for supervision of normal first pregnancy in first trimester    Other Visit Diagnoses    [redacted] weeks gestation of pregnancy    -  Primary    [place] Ob Comp + 14 Wk  Result Date: 07/16/2017 ULTRASOUND REPORT Location: [facility] Date of Service: 07/16/2017 Indications:Anatomy U/S Findings: Mason JimSingleton intrauterine pregnancy is visualized with FHR at 149 BPM. Biometrics give an (U/S) Gestational age of 3570w1d and an (U/S) EDD of 11/25/2017; this correlates with the clinically established Estimated Date of Delivery: 11/24/17 Fetal presentation is Cephalic. EFW: 13oz. Placenta: Anterior, grade 0. AFI: subjectively normal. Anatomic survey is incomplete for nose/lips due to fetal position and normal; Gender - female.  Right Ovary is normal in appearance. Left Ovary is normal appearance. Survey of the adnexa demonstrates no adnexal masses. There is no free peritoneal fluid in the cul de sac. Impression: 1. 71110w2d Viable Singleton Intrauterine pregnancy by U/S. 2. (U/S) EDD is consistent with Clinically established Estimated Date of Delivery: 11/24/17 . 3. Normal Anatomy Scan Recommendations: 1.Clinical correlation with the patient's History and Physical Exam. 2. Follow up in 3-4 weeks to complete anatomic survey Willette AlmaKristen Priestley, RDMS, RVT Review of ULTRASOUND.    I have personally reviewed images and report of [facility]    Plan of management to be discussed with patient. Annamarie MajorPaul Cristal Qadir, MD, Merlinda FrederickFACOG  [facility], [facility] 07/16/2017  2:27 PM   Review of ULTRASOUND.    I have personally reviewed images and [facility]    Plan of management to be discussed with patient.  Annamarie MajorPaul Melissaann Dizdarevic, MD, Merlinda FrederickFACOG [facility], [facility] 07/16/2017  2:34 PM

## 2017-08-13 ENCOUNTER — Ambulatory Visit (INDEPENDENT_AMBULATORY_CARE_PROVIDER_SITE_OTHER): Payer: BLUE CROSS/BLUE SHIELD | Admitting: Obstetrics and Gynecology

## 2017-08-13 ENCOUNTER — Encounter: Payer: Self-pay | Admitting: Obstetrics and Gynecology

## 2017-08-13 VITALS — BP 114/74 | Wt 144.0 lb

## 2017-08-13 DIAGNOSIS — Z3401 Encounter for supervision of normal first pregnancy, first trimester: Secondary | ICD-10-CM

## 2017-08-13 DIAGNOSIS — Z131 Encounter for screening for diabetes mellitus: Secondary | ICD-10-CM

## 2017-08-13 DIAGNOSIS — Z3A25 25 weeks gestation of pregnancy: Secondary | ICD-10-CM

## 2017-08-13 DIAGNOSIS — Z113 Encounter for screening for infections with a predominantly sexual mode of transmission: Secondary | ICD-10-CM

## 2017-08-13 NOTE — Progress Notes (Signed)
  Routine Prenatal Care Visit  Subjective  Michelle Miller is a 27 y.o. G1P0000 at 5819w2d being seen today for ongoing prenatal care.  She is currently monitored for the following issues for this low-risk pregnancy and has Encounter for supervision of normal first pregnancy in first trimester on their problem list.  ----------------------------------------------------------------------------------- Patient reports no complaints.   Contractions: Not present. Vag. Bleeding: None.  Movement: Present. Denies leaking of fluid.  ----------------------------------------------------------------------------------- The following portions of the patient's history were reviewed and updated as appropriate: allergies, current medications, past family history, past medical history, past social history, past surgical history and problem list. Problem list updated.   Objective  Blood pressure 114/74, weight 144 lb (65.3 kg), last menstrual period 02/17/2017. Pregravid weight 125 lb (56.7 kg) Total Weight Gain 19 lb (8.618 kg) Urinalysis: Urine Protein: Negative Urine Glucose: Negative  Fetal Status: Fetal Heart Rate (bpm): 145 Fundal Height: 25 cm Movement: Present     General:  Alert, oriented and cooperative. Patient is in no acute distress.  Skin: Skin is warm and dry. No rash noted.   Cardiovascular: Normal heart rate noted  Respiratory: Normal respiratory effort, no problems with respiration noted  Abdomen: Soft, gravid, appropriate for gestational age. Pain/Pressure: Absent     Pelvic:  Cervical exam deferred        Extremities: Normal range of motion.     Mental Status: Normal mood and affect. Normal behavior. Normal judgment and thought content.   Assessment   27 y.o. G1P0000 at 6219w2d by  11/24/2017, by Ultrasound presenting for routine prenatal visit  Plan   pregnancy Problems (from 03/24/17 to present)    Problem Noted Resolved   Encounter for supervision of normal first pregnancy in first  trimester 03/24/2017 by Oswaldo ConroySchmid, Jacelyn Y, CNM No   Overview Addendum 06/17/2017  2:31 PM by Oswaldo ConroySchmid, Jacelyn Y, CNM    Clinic [facility] Prenatal Labs  Dating LMP=8w 6d US Blood type: AB/Positive/-- (10/16 1123)   Genetic Screen 1 Screen:     NIPS: Antibody:Negative (10/16 1123)  Anatomic US  Rubella: 8.90 (10/16 1123) Varicella: Immune  GTT Early:               Third trimester:  RPR: Non Reactive (10/16 1123)   Rhogam  HBsAg: Negative (10/16 1123)   TDaP vaccine                       Flu Shot: HIV: Non Reactive  Baby Food                                GBS:   Contraception  Pap: 01/13/2017, NILM  CBB     CS/VBAC    Support Person                  Preterm labor symptoms and general obstetric precautions including but not limited to vaginal bleeding, contractions, leaking of fluid and fetal movement were reviewed in detail with the patient. Please refer to After Visit Summary for other counseling recommendations.   Return in about 2 weeks (around 08/27/2017) for schedule u/s for completion anatomy, schedule 28 week labs and routine prenatal.   Thomasene MohairStephen Abednego Yeates, MD, Merlinda FrederickFACOG [facility]  [facility] 08/13/2017 4:56 PM

## 2017-08-21 ENCOUNTER — Other Ambulatory Visit: Payer: Self-pay

## 2017-08-21 MED ORDER — PROVIDA OB 20-20-1.25 MG PO CAPS: 1.0000 | ORAL_CAPSULE | Freq: Every day | ORAL | 4 refills | Status: DC

## 2017-08-21 NOTE — Telephone Encounter (Signed)
Pt calling for refill of [facility] [address]  [phone]  Pt aware refill eRx'd.

## 2017-08-27 ENCOUNTER — Other Ambulatory Visit: Payer: BLUE CROSS/BLUE SHIELD

## 2017-08-27 ENCOUNTER — Ambulatory Visit (INDEPENDENT_AMBULATORY_CARE_PROVIDER_SITE_OTHER): Payer: BLUE CROSS/BLUE SHIELD | Admitting: Maternal Newborn

## 2017-08-27 ENCOUNTER — Encounter: Payer: Self-pay | Admitting: Maternal Newborn

## 2017-08-27 ENCOUNTER — Other Ambulatory Visit: Payer: Self-pay | Admitting: Advanced Practice Midwife

## 2017-08-27 ENCOUNTER — Ambulatory Visit (INDEPENDENT_AMBULATORY_CARE_PROVIDER_SITE_OTHER): Payer: BLUE CROSS/BLUE SHIELD

## 2017-08-27 VITALS — BP 90/50 | Wt 145.0 lb

## 2017-08-27 DIAGNOSIS — Z3401 Encounter for supervision of normal first pregnancy, first trimester: Secondary | ICD-10-CM | POA: Diagnosis not present

## 2017-08-27 DIAGNOSIS — Z131 Encounter for screening for diabetes mellitus: Secondary | ICD-10-CM

## 2017-08-27 DIAGNOSIS — Z3A27 27 weeks gestation of pregnancy: Secondary | ICD-10-CM

## 2017-08-27 DIAGNOSIS — Z113 Encounter for screening for infections with a predominantly sexual mode of transmission: Secondary | ICD-10-CM | POA: Diagnosis not present

## 2017-08-27 MED ORDER — CITRANATAL BLOOM 90-1 MG PO TABS: 1.0000 | ORAL_TABLET | Freq: Every day | ORAL | 2 refills | Status: DC

## 2017-08-27 NOTE — Progress Notes (Signed)
    Routine Prenatal Care Visit  Subjective  Michelle Miller is a 27 y.o. G1P0000 at 4560w2d being seen today for ongoing prenatal care.  She is currently monitored for the following issues for this low-risk pregnancy and has Encounter for supervision of normal first pregnancy in first trimester on their problem list.  ----------------------------------------------------------------------------------- Patient reports occasional hip pain on the right side.  It is intermittent, lasts about 10 minutes per episode, is not associated with any activity, resolves with movement. Contractions: Not present. Vag. Bleeding: None.  Movement: Present. Denies leaking of fluid.  ----------------------------------------------------------------------------------- The following portions of the patient's history were reviewed and updated as appropriate: allergies, current medications, past family history, past medical history, past social history, past surgical history and problem list. Problem list updated.   Objective  Blood pressure (!) 90/50, weight 145 lb (65.8 kg), last menstrual period 02/17/2017. Pregravid weight 125 lb (56.7 kg) Total Weight Gain 20 lb (9.072 kg) Urinalysis: Urine Protein: Negative Urine Glucose: Negative  Fetal Status: Fetal Heart Rate (bpm): 161 Fundal Height: 27 cm Movement: Present     General:  Alert, oriented and cooperative. Patient is in no acute distress.  Skin: Skin is warm and dry. No rash noted.   Cardiovascular: Normal heart rate noted  Respiratory: Normal respiratory effort, no problems with respiration noted  Abdomen: Soft, gravid, appropriate for gestational age. Pain/Pressure: Absent     Pelvic:  Cervical exam deferred        Extremities: Normal range of motion.     Mental Status: Normal mood and affect. Normal behavior. Normal judgment and thought content.     Assessment   27 y.o. G1P0000 at 7860w2d, EDD 11/24/2017 by Ultrasound presenting for routine prenatal  visit.  Plan   pregnancy Problems (from 03/24/17 to present)    Problem Noted Resolved   Encounter for supervision of normal first pregnancy in first trimester 03/24/2017 by Oswaldo ConroySchmid, Jacelyn Y, CNM No   Overview Addendum 06/17/2017  2:31 PM by Oswaldo ConroySchmid, Jacelyn Y, CNM    Clinic [facility] Prenatal Labs  Dating LMP=8w 6d US Blood type: AB/Positive/-- (10/16 1123)   Genetic Screen 1 Screen:     NIPS: Antibody:Negative (10/16 1123)  Anatomic US  Rubella: 8.90 (10/16 1123) Varicella: Immune  GTT Early:               Third trimester:  RPR: Non Reactive (10/16 1123)   Rhogam  HBsAg: Negative (10/16 1123)   TDaP vaccine                       Flu Shot: HIV: Non Reactive  Baby Food                                GBS:   Contraception  Pap: 01/13/2017, NILM  CBB     CS/VBAC    Support Person               Talked about measures to help with hip pain.  Gestational age appropriate obstetric precautions were reviewed with the patient.  Return in about 2 weeks (around 09/10/2017) for ROB.  Marcelyn BruinsJacelyn Schmid, CNM 08/27/2017  4:17 PM

## 2017-08-27 NOTE — Progress Notes (Signed)
C/o has question about right hip pain vs round ligament pain, lasts 6031m off and on.rj

## 2017-08-27 NOTE — Progress Notes (Signed)
Rx Citranatal Bloom [facility] to [address] [facility] per request from pharmacy due to out of stock Provida OB.

## 2017-08-28 LAB — 28 WEEK RH+PANEL
BASOS ABS: 0 10*3/uL (ref 0.0–0.2)
Basos: 0 %
EOS (ABSOLUTE): 0.3 10*3/uL (ref 0.0–0.4)
Eos: 3 %
GESTATIONAL DIABETES SCREEN: 93 mg/dL (ref 65–139)
HEMATOCRIT: 30.6 % — AB (ref 34.0–46.6)
HEMOGLOBIN: 10 g/dL — AB (ref 11.1–15.9)
HIV Screen 4th Generation wRfx: NONREACTIVE
Immature Grans (Abs): 0.1 10*3/uL (ref 0.0–0.1)
Immature Granulocytes: 1 %
LYMPHS ABS: 1.6 10*3/uL (ref 0.7–3.1)
Lymphs: 14 %
MCH: 29.8 pg (ref 26.6–33.0)
MCHC: 32.7 g/dL (ref 31.5–35.7)
MCV: 91 fL (ref 79–97)
Monocytes Absolute: 0.9 10*3/uL (ref 0.1–0.9)
Monocytes: 8 %
NEUTROS ABS: 8.5 10*3/uL — AB (ref 1.4–7.0)
Neutrophils: 74 %
PLATELETS: 272 10*3/uL (ref 150–379)
RBC: 3.36 x10E6/uL — ABNORMAL LOW (ref 3.77–5.28)
RDW: 13.1 % (ref 12.3–15.4)
RPR: NONREACTIVE
WBC: 11.4 10*3/uL — AB (ref 3.4–10.8)

## 2017-09-09 ENCOUNTER — Telehealth: Payer: Self-pay

## 2017-09-09 NOTE — Telephone Encounter (Signed)
Pt is currently 29 weeks c/o being up most of the night with nausea vomiting and diarrhea. After initial episode of vomiting continued to have "foamy/spit type vomit" pt reports good fetal movement and trying to eat crackers and drink water this morning. Pt stated okay to leave msg due to being at work.  Left msg for pt to continue to try to eat small portions of bland foods with small sips of water or gatorade. Advised possible virus or stomach bug and continue to monitor x 48 hours then contact office if sxs have not resolved. Also advised if fever should not be at work. Pt to call back if any additional questions or concerns.   Cb# [phone]

## 2017-09-11 ENCOUNTER — Encounter: Payer: Self-pay | Admitting: Advanced Practice Midwife

## 2017-09-11 ENCOUNTER — Ambulatory Visit (INDEPENDENT_AMBULATORY_CARE_PROVIDER_SITE_OTHER): Payer: BLUE CROSS/BLUE SHIELD | Admitting: Advanced Practice Midwife

## 2017-09-11 VITALS — BP 104/62 | Wt 143.0 lb

## 2017-09-11 DIAGNOSIS — Z3A29 29 weeks gestation of pregnancy: Secondary | ICD-10-CM

## 2017-09-11 NOTE — Patient Instructions (Signed)
Third Trimester of Pregnancy The third trimester is from week 28 through week 40 (months 7 through 9). The third trimester is a time when the unborn baby (fetus) is growing rapidly. At the end of the ninth month, the fetus is about 20 inches in length and weighs 6-10 pounds. Body changes during your third trimester Your body will continue to go through many changes during pregnancy. The changes vary from woman to woman. During the third trimester:  Your weight will continue to increase. You can expect to gain 25-35 pounds (11-16 kg) by the end of the pregnancy.  You may begin to get stretch marks on your hips, abdomen, and breasts.  You may urinate more often because the fetus is moving lower into your pelvis and pressing on your bladder.  You may develop or continue to have heartburn. This is caused by increased hormones that slow down muscles in the digestive tract.  You may develop or continue to have constipation because increased hormones slow digestion and cause the muscles that push waste through your intestines to relax.  You may develop hemorrhoids. These are swollen veins (varicose veins) in the rectum that can itch or be painful.  You may develop swollen, bulging veins (varicose veins) in your legs.  You may have increased body aches in the pelvis, back, or thighs. This is due to weight gain and increased hormones that are relaxing your joints.  You may have changes in your hair. These can include thickening of your hair, rapid growth, and changes in texture. Some women also have hair loss during or after pregnancy, or hair that feels dry or thin. Your hair will most likely return to normal after your baby is born.  Your breasts will continue to grow and they will continue to become tender. A yellow fluid (colostrum) may leak from your breasts. This is the first milk you are producing for your baby.  Your belly button may stick out.  You may notice more swelling in your hands,  face, or ankles.  You may have increased tingling or numbness in your hands, arms, and legs. The skin on your belly may also feel numb.  You may feel short of breath because of your expanding uterus.  You may have more problems sleeping. This can be caused by the size of your belly, increased need to urinate, and an increase in your body's metabolism.  You may notice the fetus "dropping," or moving lower in your abdomen (lightening).  You may have increased vaginal discharge.  You may notice your joints feel loose and you may have pain around your pelvic bone.  What to expect at prenatal visits You will have prenatal exams every 2 weeks until week 36. Then you will have weekly prenatal exams. During a routine prenatal visit:  You will be weighed to make sure you and the baby are growing normally.  Your blood pressure will be taken.  Your abdomen will be measured to track your baby's growth.  The fetal heartbeat will be listened to.  Any test results from the previous visit will be discussed.  You may have a cervical check near your due date to see if your cervix has softened or thinned (effaced).  You will be tested for Group B streptococcus. This happens between 35 and 37 weeks.  Your health care provider may ask you:  What your birth plan is.  How you are feeling.  If you are feeling the baby move.  If you have had   any abnormal symptoms, such as leaking fluid, bleeding, severe headaches, or abdominal cramping.  If you are using any tobacco products, including cigarettes, chewing tobacco, and electronic cigarettes.  If you have any questions.  Other tests or screenings that may be performed during your third trimester include:  Blood tests that check for low iron levels (anemia).  Fetal testing to check the health, activity level, and growth of the fetus. Testing is done if you have certain medical conditions or if there are problems during the  pregnancy.  Nonstress test (NST). This test checks the health of your baby to make sure there are no signs of problems, such as the baby not getting enough oxygen. During this test, a belt is placed around your belly. The baby is made to move, and its heart rate is monitored during movement.  What is false labor? False labor is a condition in which you feel small, irregular tightenings of the muscles in the womb (contractions) that usually go away with rest, changing position, or drinking water. These are called Braxton Hicks contractions. Contractions may last for hours, days, or even weeks before true labor sets in. If contractions come at regular intervals, become more frequent, increase in intensity, or become painful, you should see your health care provider. What are the signs of labor?  Abdominal cramps.  Regular contractions that start at 10 minutes apart and become stronger and more frequent with time.  Contractions that start on the top of the uterus and spread down to the lower abdomen and back.  Increased pelvic pressure and dull back pain.  A watery or bloody mucus discharge that comes from the vagina.  Leaking of amniotic fluid. This is also known as your "water breaking." It could be a slow trickle or a gush. Let your health care provider know if it has a color or strange odor. If you have any of these signs, call your health care provider right away, even if it is before your due date. Follow these instructions at home: Medicines  Follow your health care provider's instructions regarding medicine use. Specific medicines may be either safe or unsafe to take during pregnancy.  Take a prenatal vitamin that contains at least 600 micrograms (mcg) of folic acid.  If you develop constipation, try taking a stool softener if your health care provider approves. Eating and drinking  Eat a balanced diet that includes fresh fruits and vegetables, whole grains, good sources of protein  such as meat, eggs, or tofu, and low-fat dairy. Your health care provider will help you determine the amount of weight gain that is right for you.  Avoid raw meat and uncooked cheese. These carry germs that can cause birth defects in the baby.  If you have low calcium intake from food, talk to your health care provider about whether you should take a daily calcium supplement.  Eat four or five small meals rather than three large meals a day.  Limit foods that are high in fat and processed sugars, such as fried and sweet foods.  To prevent constipation: ? Drink enough fluid to keep your urine clear or pale yellow. ? Eat foods that are high in fiber, such as fresh fruits and vegetables, whole grains, and beans. Activity  Exercise only as directed by your health care provider. Most women can continue their usual exercise routine during pregnancy. Try to exercise for 30 minutes at least 5 days a week. Stop exercising if you experience uterine contractions.  Avoid heavy   lifting.  Do not exercise in extreme heat or humidity, or at high altitudes.  Wear low-heel, comfortable shoes.  Practice good posture.  You may continue to have sex unless your health care provider tells you otherwise. Relieving pain and discomfort  Take frequent breaks and rest with your legs elevated if you have leg cramps or low back pain.  Take warm sitz baths to soothe any pain or discomfort caused by hemorrhoids. Use hemorrhoid cream if your health care provider approves.  Wear a good support bra to prevent discomfort from breast tenderness.  If you develop varicose veins: ? Wear support pantyhose or compression stockings as told by your healthcare provider. ? Elevate your feet for 15 minutes, 3-4 times a day. Prenatal care  Write down your questions. Take them to your prenatal visits.  Keep all your prenatal visits as told by your health care provider. This is important. Safety  Wear your seat belt at  all times when driving.  Make a list of emergency phone numbers, including numbers for family, friends, the hospital, and police and fire departments. General instructions  Avoid cat litter boxes and soil used by cats. These carry germs that can cause birth defects in the baby. If you have a cat, ask someone to clean the litter box for you.  Do not travel far distances unless it is absolutely necessary and only with the approval of your health care provider.  Do not use hot tubs, steam rooms, or saunas.  Do not drink alcohol.  Do not use any products that contain nicotine or tobacco, such as cigarettes and e-cigarettes. If you need help quitting, ask your health care provider.  Do not use any medicinal herbs or unprescribed drugs. These chemicals affect the formation and growth of the baby.  Do not douche or use tampons or scented sanitary pads.  Do not cross your legs for long periods of time.  To prepare for the arrival of your baby: ? Take prenatal classes to understand, practice, and ask questions about labor and delivery. ? Make a trial run to the hospital. ? Visit the hospital and tour the maternity area. ? Arrange for maternity or paternity leave through employers. ? Arrange for family and friends to take care of pets while you are in the hospital. ? Purchase a rear-facing car seat and make sure you know how to install it in your car. ? Pack your hospital bag. ? Prepare the baby's nursery. Make sure to remove all pillows and stuffed animals from the baby's crib to prevent suffocation.  Visit your dentist if you have not gone during your pregnancy. Use a soft toothbrush to brush your teeth and be gentle when you floss. Contact a health care provider if:  You are unsure if you are in labor or if your water has broken.  You become dizzy.  You have mild pelvic cramps, pelvic pressure, or nagging pain in your abdominal area.  You have lower back pain.  You have persistent  nausea, vomiting, or diarrhea.  You have an unusual or bad smelling vaginal discharge.  You have pain when you urinate. Get help right away if:  Your water breaks before 37 weeks.  You have regular contractions less than 5 minutes apart before 37 weeks.  You have a fever.  You are leaking fluid from your vagina.  You have spotting or bleeding from your vagina.  You have severe abdominal pain or cramping.  You have rapid weight loss or weight gain.    You have shortness of breath with chest pain.  You notice sudden or extreme swelling of your face, hands, ankles, feet, or legs.  Your baby makes fewer than 10 movements in 2 hours.  You have severe headaches that do not go away when you take medicine.  You have vision changes. Summary  The third trimester is from week 28 through week 40, months 7 through 9. The third trimester is a time when the unborn baby (fetus) is growing rapidly.  During the third trimester, your discomfort may increase as you and your baby continue to gain weight. You may have abdominal, leg, and back pain, sleeping problems, and an increased need to urinate.  During the third trimester your breasts will keep growing and they will continue to become tender. A yellow fluid (colostrum) may leak from your breasts. This is the first milk you are producing for your baby.  False labor is a condition in which you feel small, irregular tightenings of the muscles in the womb (contractions) that eventually go away. These are called Braxton Hicks contractions. Contractions may last for hours, days, or even weeks before true labor sets in.  Signs of labor can include: abdominal cramps; regular contractions that start at 10 minutes apart and become stronger and more frequent with time; watery or bloody mucus discharge that comes from the vagina; increased pelvic pressure and dull back pain; and leaking of amniotic fluid. This information is not intended to replace advice  given to you by your health care provider. Make sure you discuss any questions you have with your health care provider. Document Released: 05/20/2001 Document Revised: 11/01/2015 Document Reviewed: 07/27/2012 Elsevier Interactive Patient Education  2017 [employer]  

## 2017-09-11 NOTE — Progress Notes (Signed)
No vb.no lof. TDAP nv.

## 2017-09-11 NOTE — Progress Notes (Signed)
  Routine Prenatal Care Visit  Subjective  Michelle Miller is a 27 y.o. G1P0000 at 2021w3d being seen today for ongoing prenatal care.  She is currently monitored for the following issues for this low-risk pregnancy and has Encounter for supervision of normal first pregnancy in first trimester on their problem list.  ----------------------------------------------------------------------------------- Patient reports no complaints.  Discussed results of 28 wk labs- mild anemia. She is taking PNV with Fe Contractions: Not present. Vag. Bleeding: None.  Movement: Present. Denies leaking of fluid.  ----------------------------------------------------------------------------------- The following portions of the patient's history were reviewed and updated as appropriate: allergies, current medications, past family history, past medical history, past social history, past surgical history and problem list. Problem list updated.   Objective  Blood pressure 104/62, weight 143 lb (64.9 kg), last menstrual period 02/17/2017. Pregravid weight 125 lb (56.7 kg) Total Weight Gain 18 lb (8.165 kg) Urinalysis:      Fetal Status: Fetal Heart Rate (bpm): 145 Fundal Height: 29 cm Movement: Present     General:  Alert, oriented and cooperative. Patient is in no acute distress.  Skin: Skin is warm and dry. No rash noted.   Cardiovascular: Normal heart rate noted  Respiratory: Normal respiratory effort, no problems with respiration noted  Abdomen: Soft, gravid, appropriate for gestational age. Pain/Pressure: Absent     Pelvic:  Cervical exam deferred        Extremities: Normal range of motion.  Edema: None  Mental Status: Normal mood and affect. Normal behavior. Normal judgment and thought content.   Assessment   27 y.o. G1P0000 at 2921w3d by  11/24/2017, by Ultrasound presenting for routine prenatal visit  Plan   pregnancy Problems (from 03/24/17 to present)    Problem Noted Resolved   Encounter for  supervision of normal first pregnancy in first trimester 03/24/2017 by Oswaldo ConroySchmid, Jacelyn Y, CNM No   Overview Addendum 06/17/2017  2:31 PM by Oswaldo ConroySchmid, Jacelyn Y, CNM    [facility]inic [facility] Prenatal Labs  Dating LMP=8w 6d US Blood type: AB/Positive/-- (10/16 1123)   Genetic Screen 1 Screen:     NIPS: Antibody:Negative (10/16 1123)  Anatomic US  Rubella: 8.90 (10/16 1123) Varicella: Immune  GTT Early:               Third trimester:  RPR: Non Reactive (10/16 1123)   Rhogam  HBsAg: Negative (10/16 1123)   TDaP vaccine                       Flu Shot: HIV: Non Reactive  Baby Food                                GBS:   Contraception  Pap: 01/13/2017, NILM  CBB     CS/VBAC NA   Support Person                  Preterm labor symptoms and general obstetric precautions including but not limited to vaginal bleeding, contractions, leaking of fluid and fetal movement were reviewed in detail with the patient. Please refer to After Visit Summary for other counseling recommendations.   Return in about 2 weeks (around 09/25/2017) for rob.  Tresea MallJane Roran Wegner, CNM 09/11/2017 2:53 PM

## 2017-10-02 ENCOUNTER — Encounter: Payer: Self-pay | Admitting: Maternal Newborn

## 2017-10-02 ENCOUNTER — Ambulatory Visit (INDEPENDENT_AMBULATORY_CARE_PROVIDER_SITE_OTHER): Payer: BLUE CROSS/BLUE SHIELD | Admitting: Maternal Newborn

## 2017-10-02 VITALS — BP 110/80 | Wt 144.0 lb

## 2017-10-02 DIAGNOSIS — Z3401 Encounter for supervision of normal first pregnancy, first trimester: Secondary | ICD-10-CM

## 2017-10-02 DIAGNOSIS — Z3A32 32 weeks gestation of pregnancy: Secondary | ICD-10-CM

## 2017-10-02 NOTE — Progress Notes (Signed)
    Routine Prenatal Care Visit  Subjective  Michelle Miller is a 27 y.o. G1P0000 at 3578w3d being seen today for ongoing prenatal care.  She is currently monitored for the following issues for this low-risk pregnancy and has Encounter for supervision of normal first pregnancy in first trimester on their problem list.  ----------------------------------------------------------------------------------- Patient reports no complaints.   Contractions: Not present. Vag. Bleeding: None.  Movement: Present. Denies leaking of fluid.  ----------------------------------------------------------------------------------- The following portions of the patient's history were reviewed and updated as appropriate: allergies, current medications, past family history, past medical history, past social history, past surgical history and problem list. Problem list updated.   Objective  Blood pressure 110/80, weight 144 lb (65.3 kg), last menstrual period 02/17/2017. Pregravid weight 125 lb (56.7 kg) Total Weight Gain 19 lb (8.618 kg) Urinalysis: No sample today Fetal Status: Fetal Heart Rate (bpm): 138 Fundal Height: 31 cm Movement: Present     General:  Alert, oriented and cooperative. Patient is in no acute distress.  Skin: Skin is warm and dry. No rash noted.   Cardiovascular: Normal heart rate noted  Respiratory: Normal respiratory effort, no problems with respiration noted  Abdomen: Soft, gravid, appropriate for gestational age. Pain/Pressure: Absent     Pelvic:  Cervical exam deferred        Extremities: Normal range of motion.  Edema: None  Mental Status: Normal mood and affect. Normal behavior. Normal judgment and thought content.     Assessment   27 y.o. G1P0000 at 6778w3d by  11/24/2017, by Ultrasound presenting for routine prenatal visit  Plan   pregnancy Problems (from 03/24/17 to present)    Problem Noted Resolved   Encounter for supervision of normal first pregnancy in first trimester  03/24/2017 by Oswaldo ConroySchmid, Jacelyn Y, CNM No   Overview Addendum 06/17/2017  2:31 PM by Oswaldo ConroySchmid, Jacelyn Y, CNM    [facility]inic [facility] Prenatal Labs  Dating LMP=8w 6d US Blood type: AB/Positive/-- (10/16 1123)   Genetic Screen 1 Screen:     NIPS: Antibody:Negative (10/16 1123)  Anatomic US  Rubella: 8.90 (10/16 1123) Varicella: Immune  GTT Early:               Third trimester:  RPR: Non Reactive (10/16 1123)   Rhogam  HBsAg: Negative (10/16 1123)   TDaP vaccine                       Flu Shot: HIV: Non Reactive  Baby Food                                GBS:   Contraception  Pap: 01/13/2017, NILM  CBB     CS/VBAC    Support Person               Declines TDaP today, will let us know if she decides to get it later. She had questions about getting an ultrasound today. Explained indications for ultrasound and that none is currently indicated.  Preterm labor symptoms and general obstetric precautions including but not limited to vaginal bleeding, contractions, leaking of fluid and fetal movement were reviewed.  Return in about 2 weeks (around 10/16/2017) for ROB.  Marcelyn BruinsJacelyn Schmid, CNM 10/04/2017  8:26 PM

## 2017-10-16 ENCOUNTER — Ambulatory Visit (INDEPENDENT_AMBULATORY_CARE_PROVIDER_SITE_OTHER): Payer: BLUE CROSS/BLUE SHIELD | Admitting: Maternal Newborn

## 2017-10-16 ENCOUNTER — Encounter: Payer: Self-pay | Admitting: Maternal Newborn

## 2017-10-16 VITALS — BP 100/60 | Wt 144.0 lb

## 2017-10-16 DIAGNOSIS — Z3401 Encounter for supervision of normal first pregnancy, first trimester: Secondary | ICD-10-CM

## 2017-10-16 DIAGNOSIS — Z23 Encounter for immunization: Secondary | ICD-10-CM

## 2017-10-16 DIAGNOSIS — Z3A34 34 weeks gestation of pregnancy: Secondary | ICD-10-CM

## 2017-10-16 NOTE — Progress Notes (Signed)
    Routine Prenatal Care Visit  Subjective  Michelle Miller is a 27 y.o. G1P0000 at [redacted]w[redacted]d being seen today for ongoing prenatal care.  She is currently monitored for the following issues for this low-risk pregnancy and has Encounter for supervision of normal first pregnancy in first trimester on their problem list.  ----------------------------------------------------------------------------------- Patient reports occasional Braxton-Hicks contractions.   Contractions: Not present. Vag. Bleeding: None.  Movement: Present. Denies leaking of fluid.  ----------------------------------------------------------------------------------- The following portions of the patient's history were reviewed and updated as appropriate: allergies, current medications, past family history, past medical history, past social history, past surgical history and problem list. Problem list updated.   Objective  Blood pressure 100/60, weight 144 lb (65.3 kg), last menstrual period 02/17/2017. Pregravid weight 125 lb (56.7 kg) Total Weight Gain 19 lb (8.618 kg) Urinalysis: Urine Protein: Trace Urine Glucose: Negative  Fetal Status: Fetal Heart Rate (bpm): 144 Fundal Height: 33 cm Movement: Present     General:  Alert, oriented and cooperative. Patient is in no acute distress.  Skin: Skin is warm and dry. No rash noted.   Cardiovascular: Normal heart rate noted  Respiratory: Normal respiratory effort, no problems with respiration noted  Abdomen: Soft, gravid, appropriate for gestational age. Pain/Pressure: Absent     Pelvic:  Cervical exam deferred        Extremities: Normal range of motion.  Edema: None  Mental Status: Normal mood and affect. Normal behavior. Normal judgment and thought content.     Assessment   26 y.o. G1P0000 at [redacted]w[redacted]d, EDD 11/24/2017 by Ultrasound presenting for routine prenatal visit.  Plan   pregnancy Problems (from 03/24/17 to present)    Problem Noted Resolved   Encounter for  supervision of normal first pregnancy in first trimester 03/24/2017 by Oswaldo Conroy, CNM No   Overview Addendum 06/17/2017  2:31 PM by Oswaldo Conroy, CNM    [facility] Prenatal Labs  Dating LMP=8w 6d [place] Blood type: AB/Positive/-- (10/16 1123)   Genetic Screen 1 Screen:     NIPS: Antibody:Negative (10/16 1123)  Anatomic [place]  Rubella: 8.90 (10/16 1123) Varicella: Immune  GTT Early:               Third trimester:  RPR: Non Reactive (10/16 1123)   Rhogam  HBsAg: Negative (10/16 1123)   TDaP vaccine                       Flu Shot: HIV: Non Reactive  Baby Food                                GBS:   Contraception  Pap: 01/13/2017, NILM  CBB     CS/VBAC    Support Person               Discussed doula program and tour of Labor and Delivery. Patient desires TDaP today.  Preterm labor symptoms and general obstetric precautions were reviewed.  Return in about 2 weeks (around 10/30/2017) for ROB.  Marcelyn Bruins, CNM 10/16/2017  10:03 AM

## 2017-10-16 NOTE — Progress Notes (Signed)
ROB Pt has mild pain on right side that come and go. No Lof

## 2017-10-30 ENCOUNTER — Ambulatory Visit (INDEPENDENT_AMBULATORY_CARE_PROVIDER_SITE_OTHER): Payer: BLUE CROSS/BLUE SHIELD | Admitting: Obstetrics and Gynecology

## 2017-10-30 ENCOUNTER — Encounter: Payer: Self-pay | Admitting: Obstetrics and Gynecology

## 2017-10-30 VITALS — BP 110/62 | Wt 149.0 lb

## 2017-10-30 DIAGNOSIS — Z3401 Encounter for supervision of normal first pregnancy, first trimester: Secondary | ICD-10-CM | POA: Diagnosis not present

## 2017-10-30 DIAGNOSIS — Z3A36 36 weeks gestation of pregnancy: Secondary | ICD-10-CM

## 2017-10-30 NOTE — Progress Notes (Signed)
  Routine Prenatal Care Visit  Subjective  Michelle Miller is a 27 y.o. G1P0000 at [redacted]w[redacted]d being seen today for ongoing prenatal care.  She is currently monitored for the following issues for this low-risk pregnancy and has Encounter for supervision of normal first pregnancy in first trimester on their problem list.  ----------------------------------------------------------------------------------- Patient reports no complaints.   Contractions: Not present. Vag. Bleeding: None.  Movement: Present. Denies leaking of fluid.  ----------------------------------------------------------------------------------- The following portions of the patient's history were reviewed and updated as appropriate: allergies, current medications, past family history, past medical history, past social history, past surgical history and problem list. Problem list updated.   Objective  Blood pressure 110/62, weight 149 lb (67.6 kg), last menstrual period 02/17/2017. Pregravid weight 125 lb (56.7 kg) Total Weight Gain 24 lb (10.9 kg) Urinalysis:      Fetal Status: Fetal Heart Rate (bpm): 135 Fundal Height: 35 cm Movement: Present  Presentation: Vertex  General:  Alert, oriented and cooperative. Patient is in no acute distress.  Skin: Skin is warm and dry. No rash noted.   Cardiovascular: Normal heart rate noted  Respiratory: Normal respiratory effort, no problems with respiration noted  Abdomen: Soft, gravid, appropriate for gestational age. Pain/Pressure: Absent     Pelvic:  Cervical exam deferred        Extremities: Normal range of motion.  Edema: None  Mental Status: Normal mood and affect. Normal behavior. Normal judgment and thought content.   Assessment   27 y.o. G1P0000 at [redacted]w[redacted]d by  11/24/2017, by Ultrasound presenting for routine prenatal visit  Plan   pregnancy Problems (from 03/24/17 to present)    Problem Noted Resolved   Encounter for supervision of normal first pregnancy in first trimester  03/24/2017 by Oswaldo Conroy, CNM No   Overview Addendum 06/17/2017  2:31 PM by Oswaldo Conroy, CNM    [facility] Prenatal Labs  Dating LMP=8w 6d [place] Blood type: AB/Positive/-- (10/16 1123)   Genetic Screen 1 Screen:     NIPS: Antibody:Negative (10/16 1123)  Anatomic [place]  Rubella: 8.90 (10/16 1123) Varicella: Immune  GTT Early:               Third trimester:  RPR: Non Reactive (10/16 1123)   Rhogam  HBsAg: Negative (10/16 1123)   TDaP vaccine                       Flu Shot: HIV: Non Reactive  Baby Food                                GBS:   Contraception  Pap: 01/13/2017, NILM  CBB     CS/VBAC    Support Person                  Preterm labor symptoms and general obstetric precautions including but not limited to vaginal bleeding, contractions, leaking of fluid and fetal movement were reviewed in detail with the patient. Please refer to After Visit Summary for other counseling recommendations.   Return in about 1 week (around 11/06/2017) for Routine Prenatal Appointment.  [name], MD, [facility], [facility] 10/30/2017 2:36 PM

## 2017-10-30 NOTE — Progress Notes (Incomplete)
  Routine Prenatal Care Visit  Subjective  Michelle Miller is a 27 y.o. G1P0000 at [redacted]w[redacted]d being seen today for ongoing prenatal care.  She is currently monitored for the following issues for this {Blank single:19197::"high-risk","low-risk"} pregnancy and has Encounter for supervision of normal first pregnancy in first trimester on their problem list.  ----------------------------------------------------------------------------------- Patient reports {sx:14538}.   Contractions: Not present. Vag. Bleeding: None.  Movement: Present. Denies leaking of fluid.  ----------------------------------------------------------------------------------- The following portions of the patient's history were reviewed and updated as appropriate: allergies, current medications, past family history, past medical history, past social history, past surgical history and problem list. Problem list updated.   Objective  Blood pressure 110/62, weight 149 lb (67.6 kg), last menstrual period 02/17/2017. Pregravid weight 125 lb (56.7 kg) Total Weight Gain 24 lb (10.9 kg) Urinalysis:      Fetal Status: Fetal Heart Rate (bpm): 135 Fundal Height: 35 cm Movement: Present  Presentation: Vertex  General:  Alert, oriented and cooperative. Patient is in no acute distress.  Skin: Skin is warm and dry. No rash noted.   Cardiovascular: Normal heart rate noted  Respiratory: Normal respiratory effort, no problems with respiration noted  Abdomen: Soft, gravid, appropriate for gestational age. Pain/Pressure: Absent     Pelvic:  {Blank single:19197::"Cervical exam performed","Cervical exam deferred"}        Extremities: Normal range of motion.  Edema: None  Mental Status: Normal mood and affect. Normal behavior. Normal judgment and thought content.   Assessment   27 y.o. G1P0000 at [redacted]w[redacted]d by  11/24/2017, by Ultrasound presenting for {Blank single:19197::"routine","work-in"} prenatal visit  Plan   pregnancy Problems (from 03/24/17 to  present)    Problem Noted Resolved   Encounter for supervision of normal first pregnancy in first trimester 03/24/2017 by Oswaldo Conroy, CNM No   Overview Addendum 06/17/2017  2:31 PM by Oswaldo Conroy, CNM    [facility] Prenatal Labs  Dating LMP=8w 6d [place] Blood type: AB/Positive/-- (10/16 1123)   Genetic Screen 1 Screen:     NIPS: Antibody:Negative (10/16 1123)  Anatomic [place]  Rubella: 8.90 (10/16 1123) Varicella: Immune  GTT Early:               Third trimester:  RPR: Non Reactive (10/16 1123)   Rhogam  HBsAg: Negative (10/16 1123)   TDaP vaccine                       Flu Shot: HIV: Non Reactive  Baby Food                                GBS:   Contraception  Pap: 01/13/2017, NILM  CBB     CS/VBAC    Support Person                  {Blank single:19197::"Term","Preterm"} labor symptoms and general obstetric precautions including but not limited to vaginal bleeding, contractions, leaking of fluid and fetal movement were reviewed in detail with the patient. Please refer to After Visit Summary for other counseling recommendations.   Return in about 1 week (around 11/06/2017) for Routine Prenatal Appointment.  [name], MD, [facility], [facility] 10/30/2017 2:36 PM

## 2017-11-04 LAB — GC/CHLAMYDIA PROBE AMP
CHLAMYDIA, DNA PROBE: NEGATIVE
Neisseria gonorrhoeae by PCR: NEGATIVE

## 2017-11-04 LAB — STREP GP B NAA: Strep Gp B NAA: NEGATIVE

## 2017-11-06 ENCOUNTER — Encounter: Payer: Self-pay | Admitting: Obstetrics and Gynecology

## 2017-11-06 ENCOUNTER — Ambulatory Visit (INDEPENDENT_AMBULATORY_CARE_PROVIDER_SITE_OTHER): Payer: BLUE CROSS/BLUE SHIELD | Admitting: Obstetrics and Gynecology

## 2017-11-06 VITALS — BP 102/58 | Wt 150.0 lb

## 2017-11-06 DIAGNOSIS — Z3A37 37 weeks gestation of pregnancy: Secondary | ICD-10-CM

## 2017-11-06 DIAGNOSIS — Z3401 Encounter for supervision of normal first pregnancy, first trimester: Secondary | ICD-10-CM

## 2017-11-06 NOTE — Progress Notes (Signed)
  Routine Prenatal Care Visit  Subjective  Michelle Miller is a 27 y.o. G1P0000 at [redacted]w[redacted]d being seen today for ongoing prenatal care.  She is currently monitored for the following issues for this low-risk pregnancy and has Encounter for supervision of normal first pregnancy in first trimester on their problem list.  ----------------------------------------------------------------------------------- Patient reports no complaints.   Contractions: Not present. Vag. Bleeding: None.  Movement: Present. Denies leaking of fluid.  ----------------------------------------------------------------------------------- The following portions of the patient's history were reviewed and updated as appropriate: allergies, current medications, past family history, past medical history, past social history, past surgical history and problem list. Problem list updated.   Objective  Blood pressure (!) 102/58, weight 150 lb (68 kg), last menstrual period 02/17/2017. Pregravid weight 125 lb (56.7 kg) Total Weight Gain 25 lb (11.3 kg) Urinalysis:      Fetal Status: Fetal Heart Rate (bpm): 155 Fundal Height: 36 cm Movement: Present  Presentation: Vertex  General:  Alert, oriented and cooperative. Patient is in no acute distress.  Skin: Skin is warm and dry. No rash noted.   Cardiovascular: Normal heart rate noted  Respiratory: Normal respiratory effort, no problems with respiration noted  Abdomen: Soft, gravid, appropriate for gestational age. Pain/Pressure: Absent     Pelvic:  Cervical exam deferred        Extremities: Normal range of motion.  Edema: None  Mental Status: Normal mood and affect. Normal behavior. Normal judgment and thought content.   Assessment   27 y.o. G1P0000 at [redacted]w[redacted]d by  11/24/2017, by Ultrasound presenting for routine prenatal visit  Plan   pregnancy Problems (from 03/24/17 to present)    Problem Noted Resolved   Encounter for supervision of normal first pregnancy in first trimester  03/24/2017 by Oswaldo Conroy, CNM No   Overview Addendum 06/17/2017  2:31 PM by Oswaldo Conroy, CNM    [facility] Prenatal Labs  Dating LMP=8w 6d [place] Blood type: AB/Positive/-- (10/16 1123)   Genetic Screen 1 Screen:     NIPS: Antibody:Negative (10/16 1123)  Anatomic [place]  Rubella: 8.90 (10/16 1123) Varicella: Immune  GTT Early:               Third trimester:  RPR: Non Reactive (10/16 1123)   Rhogam  HBsAg: Negative (10/16 1123)   TDaP vaccine                       Flu Shot: HIV: Non Reactive  Baby Food                                GBS:   Contraception  Pap: 01/13/2017, NILM  CBB     CS/VBAC    Support Person                 Term labor symptoms and general obstetric precautions including but not limited to vaginal bleeding, contractions, leaking of fluid and fetal movement were reviewed in detail with the patient. Please refer to After Visit Summary for other counseling recommendations.   Return in about 1 week (around 11/13/2017) for Routine Prenatal Appointment.  [name], MD, [facility], [facility] 11/06/2017 10:19 AM

## 2017-11-11 ENCOUNTER — Telehealth: Payer: Self-pay

## 2017-11-11 NOTE — Telephone Encounter (Signed)
Pt is 38wks preg and has a cold.  What can she take?  Okay to leave msg.  [phone]  Left detailed msg to take e.s. Tylenol, plain robitussin, hall's cough drops, gargle c warm salt water, sucrets, plain sudafed, sip hot beverages/soup, push fluids and rest.

## 2017-11-13 ENCOUNTER — Ambulatory Visit (INDEPENDENT_AMBULATORY_CARE_PROVIDER_SITE_OTHER): Payer: BLUE CROSS/BLUE SHIELD | Admitting: Maternal Newborn

## 2017-11-13 VITALS — BP 110/80 | Wt 145.0 lb

## 2017-11-13 DIAGNOSIS — Z3401 Encounter for supervision of normal first pregnancy, first trimester: Secondary | ICD-10-CM

## 2017-11-13 DIAGNOSIS — Z3A38 38 weeks gestation of pregnancy: Secondary | ICD-10-CM

## 2017-11-13 DIAGNOSIS — Z3403 Encounter for supervision of normal first pregnancy, third trimester: Secondary | ICD-10-CM

## 2017-11-13 NOTE — Progress Notes (Signed)
    Routine Prenatal Care Visit  Subjective  Michelle Miller is a 27 y.o. G1P0000 at 490w3d being seen today for ongoing prenatal care.  She is currently monitored for the following issues for this low-risk pregnancy and has Encounter for supervision of normal first pregnancy in first trimester on their problem list.  ----------------------------------------------------------------------------------- Patient reports she is recovering from a URI.   Contractions: Not present. Vag. Bleeding: None.  Movement: Present. No leaking of fluid.  ----------------------------------------------------------------------------------- The following portions of the patient's history were reviewed and updated as appropriate: allergies, current medications, past family history, past medical history, past social history, past surgical history and problem list. Problem list updated.   Objective  Blood pressure 110/80, weight 145 lb (65.8 kg), last menstrual period 02/17/2017. Pregravid weight 125 lb (56.7 kg) Total Weight Gain 20 lb (9.072 kg) Urinalysis: Urine Protein: Negative Urine Glucose: Negative  Fetal Status: Fetal Heart Rate (bpm): 143 Fundal Height: 36 cm Movement: Present  Presentation: Vertex  General:  Alert, oriented and cooperative. Patient is in no acute distress.  Skin: Skin is warm and dry. No rash noted.   Cardiovascular: Normal heart rate noted  Respiratory: Normal respiratory effort, no problems with respiration noted  Abdomen: Soft, gravid, appropriate for gestational age. Pain/Pressure: Present     Pelvic:  Cervical exam performed Dilation: 1.5 Effacement (%): 50 Station: -2  Extremities: Normal range of motion.  Edema: None  Mental Status: Normal mood and affect. Normal behavior. Normal judgment and thought content.     Assessment   27 y.o. G1P0000 at 7790w3d, EDD  11/24/2017 by Ultrasound presenting for routine prenatal visit.  Plan   pregnancy Problems (from 03/24/17 to present)      Problem Noted Resolved   Encounter for supervision of normal first pregnancy in first trimester 03/24/2017 by Oswaldo ConroySchmid, Tahir Blank Y, CNM No   Overview Addendum 06/17/2017  2:31 PM by Oswaldo ConroySchmid, Liora Myles Y, CNM    [facility] Prenatal Labs  Dating LMP=8w 6d US Blood type: AB/Positive/-- (10/16 1123)   Genetic Screen 1 Screen:     NIPS: Antibody:Negative (10/16 1123)  Anatomic US  Rubella: 8.90 (10/16 1123) Varicella: Immune  GTT Early:               Third trimester:  RPR: Non Reactive (10/16 1123)   Rhogam  HBsAg: Negative (10/16 1123)   TDaP vaccine                       Flu Shot: HIV: Non Reactive  Baby Food                                GBS:   Contraception  Pap: 01/13/2017, NILM  CBB     CS/VBAC    Support Person                 Term labor symptoms and general obstetric precautions including but not limited to vaginal bleeding, contractions, leaking of fluid and fetal movement were reviewed.  Return in about 1 week (around 11/20/2017) for ROB.  Michelle Miller, CNM 11/15/2017  8:13 PM

## 2017-11-13 NOTE — Progress Notes (Signed)
C/o cold sxs x3d; rj

## 2017-11-15 ENCOUNTER — Encounter: Payer: Self-pay | Admitting: Maternal Newborn

## 2017-11-16 ENCOUNTER — Inpatient Hospital Stay: Payer: Medicaid Other | Admitting: Anesthesiology

## 2017-11-16 ENCOUNTER — Other Ambulatory Visit: Payer: Self-pay

## 2017-11-16 ENCOUNTER — Encounter: Payer: Self-pay | Admitting: Obstetrics and Gynecology

## 2017-11-16 ENCOUNTER — Inpatient Hospital Stay
Admission: EM | Admit: 2017-11-16 | Discharge: 2017-11-18 | DRG: 806 | Disposition: A | Discharge status: Home / Self Care | Payer: Medicaid Other | Attending: Obstetrics & Gynecology | Admitting: Obstetrics & Gynecology

## 2017-11-16 ENCOUNTER — Ambulatory Visit (INDEPENDENT_AMBULATORY_CARE_PROVIDER_SITE_OTHER): Payer: BLUE CROSS/BLUE SHIELD | Admitting: Obstetrics and Gynecology

## 2017-11-16 VITALS — BP 112/66 | Wt 143.0 lb

## 2017-11-16 DIAGNOSIS — Z3401 Encounter for supervision of normal first pregnancy, first trimester: Secondary | ICD-10-CM

## 2017-11-16 DIAGNOSIS — Z3A38 38 weeks gestation of pregnancy: Secondary | ICD-10-CM

## 2017-11-16 DIAGNOSIS — D62 Acute posthemorrhagic anemia: Secondary | ICD-10-CM | POA: Diagnosis not present

## 2017-11-16 DIAGNOSIS — Z3483 Encounter for supervision of other normal pregnancy, third trimester: Secondary | ICD-10-CM | POA: Diagnosis present

## 2017-11-16 DIAGNOSIS — O9081 Anemia of the puerperium: Secondary | ICD-10-CM | POA: Diagnosis not present

## 2017-11-16 LAB — CBC
HCT: 35.6 % (ref 35.0–47.0)
Hemoglobin: 11.9 g/dL — ABNORMAL LOW (ref 12.0–16.0)
MCH: 28.6 pg (ref 26.0–34.0)
MCHC: 33.5 g/dL (ref 32.0–36.0)
MCV: 85.5 fL (ref 80.0–100.0)
PLATELETS: 307 10*3/uL (ref 150–440)
RBC: 4.16 MIL/uL (ref 3.80–5.20)
RDW: 13.2 % (ref 11.5–14.5)
WBC: 13 10*3/uL — AB (ref 3.6–11.0)

## 2017-11-16 LAB — ABO/RH: ABO/RH(D): AB POS

## 2017-11-16 MED ORDER — OXYTOCIN 40 UNITS IN LACTATED RINGERS INFUSION - SIMPLE MED
2.5000 [IU]/h | INTRAVENOUS | Status: DC
  Filled 2017-11-16: qty 1000

## 2017-11-16 MED ORDER — OXYTOCIN 10 UNIT/ML IJ SOLN
INTRAMUSCULAR | Status: AC
Start: 2017-11-16 — End: 2017-11-17
  Filled 2017-11-16: qty 2

## 2017-11-16 MED ORDER — EPHEDRINE 5 MG/ML INJ
10.0000 mg | INTRAVENOUS | Status: DC | PRN
  Filled 2017-11-16: qty 2

## 2017-11-16 MED ORDER — LIDOCAINE HCL (PF) 1 % IJ SOLN
30.0000 mL | INTRAMUSCULAR | Status: DC | PRN
  Filled 2017-11-16: qty 30

## 2017-11-16 MED ORDER — SODIUM CHLORIDE FLUSH 0.9 % IV SOLN
INTRAVENOUS | Status: AC
  Filled 2017-11-16: qty 30

## 2017-11-16 MED ORDER — FENTANYL 2.5 MCG/ML W/ROPIVACAINE 0.15% IN NS 100 ML EPIDURAL (ARMC)
EPIDURAL | Status: DC | PRN
  Administered 2017-11-16: 12 mL/h via EPIDURAL

## 2017-11-16 MED ORDER — SODIUM CHLORIDE 0.9 % IV SOLN
INTRAVENOUS | Status: DC | PRN
  Administered 2017-11-16 (×2): 5 mL via EPIDURAL

## 2017-11-16 MED ORDER — LACTATED RINGERS IV SOLN: 500.0000 mL | Freq: Once | INTRAVENOUS | Status: DC

## 2017-11-16 MED ORDER — MISOPROSTOL 200 MCG PO TABS
800.0000 ug | ORAL_TABLET | Freq: Once | ORAL | Status: DC | PRN
  Filled 2017-11-16: qty 4

## 2017-11-16 MED ORDER — BUTORPHANOL TARTRATE 2 MG/ML IJ SOLN
1.0000 mg | Freq: Once | INTRAMUSCULAR | Status: AC
  Administered 2017-11-16: 1 mg via INTRAVENOUS
  Filled 2017-11-16: qty 1

## 2017-11-16 MED ORDER — ONDANSETRON HCL 4 MG/2ML IJ SOLN
4.0000 mg | Freq: Four times a day (QID) | INTRAMUSCULAR | Status: DC | PRN
  Administered 2017-11-16 – 2017-11-17 (×2): 4 mg via INTRAVENOUS
  Filled 2017-11-16 (×2): qty 2

## 2017-11-16 MED ORDER — FENTANYL 2.5 MCG/ML W/ROPIVACAINE 0.15% IN NS 100 ML EPIDURAL (ARMC)
EPIDURAL | Status: AC
  Filled 2017-11-16: qty 100

## 2017-11-16 MED ORDER — PHENYLEPHRINE 40 MCG/ML (10ML) SYRINGE FOR IV PUSH (FOR BLOOD PRESSURE SUPPORT)
80.0000 ug | PREFILLED_SYRINGE | INTRAVENOUS | Status: DC | PRN
  Filled 2017-11-16: qty 5

## 2017-11-16 MED ORDER — AMMONIA AROMATIC IN INHA
0.3000 mL | Freq: Once | RESPIRATORY_TRACT | Status: DC | PRN
  Filled 2017-11-16: qty 10

## 2017-11-16 MED ORDER — LACTATED RINGERS IV SOLN: 500.0000 mL | INTRAVENOUS | Status: DC | PRN

## 2017-11-16 MED ORDER — LACTATED RINGERS IV SOLN
INTRAVENOUS | Status: DC
  Administered 2017-11-16 – 2017-11-17 (×3): via INTRAVENOUS

## 2017-11-16 MED ORDER — DIPHENHYDRAMINE HCL 50 MG/ML IJ SOLN: 12.5000 mg | INTRAMUSCULAR | Status: DC | PRN

## 2017-11-16 MED ORDER — FENTANYL 2.5 MCG/ML W/ROPIVACAINE 0.15% IN NS 100 ML EPIDURAL (ARMC): 12.0000 mL/h | EPIDURAL | Status: DC

## 2017-11-16 MED ORDER — BUTORPHANOL TARTRATE 1 MG/ML IJ SOLN
1.0000 mg | INTRAMUSCULAR | Status: DC | PRN
  Administered 2017-11-16: 1 mg via INTRAVENOUS
  Filled 2017-11-16: qty 1

## 2017-11-16 MED ORDER — LIDOCAINE-EPINEPHRINE (PF) 1.5 %-1:200000 IJ SOLN
INTRAMUSCULAR | Status: DC | PRN
  Administered 2017-11-16: 3 mL via EPIDURAL

## 2017-11-16 MED ORDER — LIDOCAINE HCL (PF) 1 % IJ SOLN
INTRAMUSCULAR | Status: DC | PRN
  Administered 2017-11-16: 2 mL via SUBCUTANEOUS

## 2017-11-16 MED ORDER — OXYTOCIN BOLUS FROM INFUSION
500.0000 mL | Freq: Once | INTRAVENOUS | Status: AC
  Administered 2017-11-17: 500 mL via INTRAVENOUS

## 2017-11-16 NOTE — Anesthesia Preprocedure Evaluation (Signed)
Anesthesia Evaluation  Patient identified by MRN, date of birth, ID band Patient awake    Reviewed: Allergy & Precautions, H&P , NPO status , Patient's Chart, lab work & pertinent test results, reviewed documented beta blocker date and time   History of Anesthesia Complications Negative for: history of anesthetic complications  Airway Mallampati: II  TM Distance: >3 FB Neck ROM: full    Dental no notable dental hx. (+) Teeth Intact   Pulmonary neg pulmonary ROS,           Cardiovascular Exercise Tolerance: Good negative cardio ROS       Neuro/Psych negative neurological ROS  negative psych ROS   GI/Hepatic negative GI ROS, Neg liver ROS,   Endo/Other  negative endocrine ROS  Renal/GU negative Renal ROS  negative genitourinary   Musculoskeletal   Abdominal   Peds  Hematology negative hematology ROS (+)   Anesthesia Other Findings Past Medical History: No date: Medical history non-contributory   Reproductive/Obstetrics (+) Pregnancy                             Anesthesia Physical Anesthesia Plan  ASA: II  Anesthesia Plan: Epidural   Post-op Pain Management:    Induction:   PONV Risk Score and Plan:   Airway Management Planned:   Additional Equipment:   Intra-op Plan:   Post-operative Plan:   Informed Consent: I have reviewed the patients History and Physical, chart, labs and discussed the procedure including the risks, benefits and alternatives for the proposed anesthesia with the patient or authorized representative who has indicated his/her understanding and acceptance.   Dental Advisory Given  Plan Discussed with: Anesthesiologist, CRNA and Surgeon  Anesthesia Plan Comments:         Anesthesia Quick Evaluation

## 2017-11-16 NOTE — OB Triage Note (Signed)
Pt presents with ctx since 1700, denies LOF, reports small amt of blood in mucous. Reports positive fetal movement.

## 2017-11-16 NOTE — Progress Notes (Signed)
L&D Note  S: Would like more pain medication. Stadol 1 mgm given 2 hours ago-was effective in relieving pain. O: BP 107/60 (BP Location: Left Arm)   Pulse (!) 101   Temp 98.2 F (36.8 C) (Oral)   Resp 17   Ht 5\' 2"  (1.575 m)   Wt 64.9 kg (143 lb)   LMP 02/17/2017   BMI 26.16 kg/m   General: breathing with contractions FHR: 130s with accelerations to 150s, moderate variability. Had a brief period of minimal variability. + scalp stimulation Toco: every 1 -4 min apart  Cervix: 5.5/C/-1  A: No cervical change since arrival 4 hours ago  P: AROM for moderate clear fluid Stadol for pain Epidural if desires  Farrel Connersolleen Aleeza [place], CNM

## 2017-11-16 NOTE — Anesthesia Procedure Notes (Signed)
Epidural Patient location during procedure: OB Start time: 11/16/2017 10:57 PM End time: 11/16/2017 11:10 PM  Staffing Anesthesiologist: Lenard SimmerKarenz, Mike Berntsen, MD Performed: anesthesiologist   Preanesthetic Checklist Completed: patient identified, site marked, surgical consent, pre-op evaluation, timeout performed, IV checked, risks and benefits discussed and monitors and equipment checked  Epidural Patient position: sitting Prep: ChloraPrep Patient monitoring: heart rate, continuous pulse ox and blood pressure Approach: midline Location: L3-L4 Injection technique: LOR saline  Needle:  Needle type: Tuohy  Needle gauge: 17 G Needle length: 9 cm and 9 Needle insertion depth: 4 cm Catheter type: closed end flexible Catheter size: 19 Gauge Catheter at skin depth: 8 cm Test dose: negative and 1.5% lidocaine with Epi 1:200 K  Assessment Sensory level: T10 Events: blood not aspirated, injection not painful, no injection resistance, negative IV test and no paresthesia  Additional Notes 1st attempt Pt. Evaluated and documentation done after procedure finished. Patient identified. Risks/Benefits/Options discussed with patient including but not limited to bleeding, infection, nerve damage, paralysis, failed block, incomplete pain control, headache, blood pressure changes, nausea, vomiting, reactions to medication both or allergic, itching and postpartum back pain. Confirmed with bedside nurse the patient's most recent platelet count. Confirmed with patient that they are not currently taking any anticoagulation, have any bleeding history or any family history of bleeding disorders. Patient expressed understanding and wished to proceed. All questions were answered. Sterile technique was used throughout the entire procedure. Please see nursing notes for vital signs. Test dose was given through epidural catheter and negative prior to continuing to dose epidural or start infusion. Warning signs of high  block given to the patient including shortness of breath, tingling/numbness in hands, complete motor block, or any concerning symptoms with instructions to call for help. Patient was given instructions on fall risk and not to get out of bed. All questions and concerns addressed with instructions to call with any issues or inadequate analgesia.   Patient tolerated the insertion well without immediate complications.Reason for block:procedure for pain

## 2017-11-16 NOTE — H&P (Signed)
OB History & Physical   History of Present Illness:  Chief Complaint:  "I am in labor." HPI:  Michelle Miller is a 27 y.o. 761P0000 female with EDC=11/24/2017 at 2278w6d dated by LMP=8wk6d ultrasound.  Her pregnancy has been mildly complicated by anemia.  She presents to L&D for evaluation of labor. Her contractions started at 0500 this AM and became more intense 1 hour PTA. She denies vaginal bleeding and leakage of fluid. Was 3 cm dilated at her prenatal visit earlier today. Has had a recent URI.   Prenatal care site: [facility] for   [facility] Prenatal Labs  Dating LMP=8w 6d US Blood type: AB/Positive/-- (10/16 1123)   Genetic Screen 1 Screen:  Negative   Antibody:Negative (10/16 1123)  Anatomic US  normal Rubella: 8.90 (10/16 1123) Varicella: Immune  GTT  Third Trimester: 93 RPR: Non Reactive (03/21 1615)   Rhogam Not applicable HBsAg: Negative (10/16 1123)   TDaP vaccine 10/16/2017                       Flu Shot: HIV: Non Reactive  Baby Food     Breast                           WJX:BJYNWGNFGBS:Negative (05/24 1541)  Contraception  Pap: 01/13/2017, NILM  CBB     CS/VBAC    Support Person          Has gained 18# with pregnancy  Maternal Medical History:   Past Medical History:  Diagnosis Date  . Medical history non-contributory     Past Surgical History:  Procedure Laterality Date  . NO PAST SURGERIES      No Known Allergies  Prior to Admission medications   Medication Sig Start Date End Date Taking? Authorizing Provider  ferrous sulfate 324 (65 Fe) MG TBEC Take 1 tablet (325 mg total) by mouth 2 (two) times daily. Patient not taking: Reported on 10/30/2017 02/18/17   Conard NovakJackson, Stephen D, MD  Prenat w/o A Vit-FeFum-FePo-FA (PROVIDA OB) 20-20-1.25 MG CAPS Take 1 capsule by mouth daily. 08/21/17   Conard NovakJackson, Stephen D, MD       Social History: She  reports that she has never smoked. She has never used smokeless tobacco. She reports that she  drinks alcohol. She reports that she does not use drugs.  Family History: family history includes Diabetes in her mother.   Review of Systems: Negative x 10 systems reviewed except as noted in the HPI.      Physical Exam:  Vital Signs: BP 118/86 (BP Location: Left Arm)   Pulse 100   Temp 98.3 F (36.8 C) (Oral)   Resp 16   Ht 5\' 2"  (1.575 m)   Wt 143 lb (64.9 kg)   LMP 02/17/2017   BMI 26.16 kg/m  General: breathing through contractions HEENT: normocephalic, atraumatic Heart: regular rate & rhythm.  No murmurs Lungs: clear to auscultation bilaterally Abdomen: soft, gravid, non-tender;  EFW: 6#8oz Pelvic:   External: Normal external female genitalia  Cervix: Dilation: 5 / Effacement (%): 100 / Station: 0 /vertex per RN    Extremities: non-tender, symmetric, no edema bilaterally.  DTRs: +1  Neurologic: Alert & oriented x 3.   Baseline FHR: 135-140 baseline with accelerations and moderate variability Toco: contractions every 2-4 min apart    Assessment:  Michelle Miller is a 27 y.o. G1P0000 female at 7678w6d in labor  FWB: cat 1 tracing   Plan:  1. Admit to Labor & Delivery -anticipate vaginal delivery 2. CBC, T&S, Clrs, IVF 3. GBS negative.   4. Consents obtained. 5. Pain relief measures discussed. Is interested in a doula and one was called  Farrel Conners  11/16/2017 5:14 PM

## 2017-11-16 NOTE — Progress Notes (Signed)
    Routine Prenatal Care Visit  Subjective  Michelle Miller is a 27 y.o. G1P0000 at 462w6d being seen today for ongoing prenatal care.  She is currently monitored for the following issues for this low-risk pregnancy and has Encounter for supervision of normal first pregnancy in first trimester on their problem list.  ----------------------------------------------------------------------------------- Patient reports bilateral hip pain.   Contractions: Not present. Vag. Bleeding: Scant.  Movement: Present. Denies leaking of fluid.  ----------------------------------------------------------------------------------- The following portions of the patient's history were reviewed and updated as appropriate: allergies, current medications, past family history, past medical history, past social history, past surgical history and problem list. Problem list updated.   Objective  Blood pressure 112/66, weight 143 lb (64.9 kg), last menstrual period 02/17/2017. Pregravid weight 125 lb (56.7 kg) Total Weight Gain 18 lb (8.165 kg) Urinalysis: Urine Protein: Negative Urine Glucose: Negative  Fetal Status: Fetal Heart Rate (bpm): 130 Fundal Height: 38 cm Movement: Present  Presentation: Vertex  General:  Alert, oriented and cooperative. Patient is in no acute distress.  Skin: Skin is warm and dry. No rash noted.   Cardiovascular: Normal heart rate noted  Respiratory: Normal respiratory effort, no problems with respiration noted  Abdomen: Soft, gravid, appropriate for gestational age. Pain/Pressure: Present     Pelvic:  Cervical exam performed Dilation: 3 Effacement (%): 90 Station: -1  Extremities: Normal range of motion.  Edema: None  ental Status: Normal mood and affect. Normal behavior. Normal judgment and thought content.     Assessment   27 y.o. G1P0000 at 572w6d by  11/24/2017, by Ultrasound presenting for routine prenatal visit  Plan   pregnancy Problems (from 03/24/17 to present)    Problem  Noted Resolved   Encounter for supervision of normal first pregnancy in first trimester 03/24/2017 by Oswaldo ConroySchmid, Jacelyn Y, CNM No   Overview Addendum 11/16/2017  9:47 AM by Natale MilchSchuman, Roniqua Kintz R, MD    [facility] Prenatal Labs  Dating LMP=8w 6d US Blood type: AB/Positive/-- (10/16 1123)   Genetic Screen 1 Screen:  Negative   Antibody:Negative (10/16 1123)  Anatomic US  normal Rubella: 8.90 (10/16 1123) Varicella: Immune  GTT  Third Trimester: 93 RPR: Non Reactive (03/21 1615)   Rhogam Not applicable HBsAg: Negative (10/16 1123)   TDaP vaccine 10/16/2017                       Flu Shot: HIV: Non Reactive  Baby Food                                WUJ:WJXBJYNWGBS:Negative (05/24 1541)  Contraception  Pap: 01/13/2017, NILM  CBB     CS/VBAC    Support Person                  Gestational age appropriate obstetric precautions including but not limited to vaginal bleeding, contractions, leaking of fluid and fetal movement were reviewed in detail with the patient.    Early labor, encouraged patient to follow up this evening or tomorrow in labor and delivery for repeat examination or if contraction pain worsens.   Return in about 1 week (around 11/23/2017) for ROB.  Adelene Idlerhristanna Jaretzy Lhommedieu MD [facility], [facility] 11/16/17 10:05 AM

## 2017-11-16 NOTE — Progress Notes (Signed)
Pt c/o hip lower abdominal pressure. Pt states she lost mucous plug last night and this morning. Spotting this morning around 6 am. Desires cervical check.

## 2017-11-17 ENCOUNTER — Encounter: Payer: Self-pay | Admitting: *Deleted

## 2017-11-17 DIAGNOSIS — Z3A38 38 weeks gestation of pregnancy: Secondary | ICD-10-CM

## 2017-11-17 MED ORDER — IBUPROFEN 600 MG PO TABS
600.0000 mg | ORAL_TABLET | Freq: Four times a day (QID) | ORAL | Status: DC
  Administered 2017-11-17 – 2017-11-18 (×5): 600 mg via ORAL
  Filled 2017-11-17 (×5): qty 1

## 2017-11-17 MED ORDER — DIBUCAINE 1 % RE OINT: 1.0000 "application " | TOPICAL_OINTMENT | RECTAL | Status: DC | PRN

## 2017-11-17 MED ORDER — SIMETHICONE 80 MG PO CHEW: 80.0000 mg | CHEWABLE_TABLET | ORAL | Status: DC | PRN

## 2017-11-17 MED ORDER — COCONUT OIL OIL: 1.0000 "application " | TOPICAL_OIL | Status: DC | PRN

## 2017-11-17 MED ORDER — BENZOCAINE-MENTHOL 20-0.5 % EX AERO: 1.0000 "application " | INHALATION_SPRAY | CUTANEOUS | Status: DC | PRN

## 2017-11-17 MED ORDER — ONDANSETRON HCL 4 MG/2ML IJ SOLN: 4.0000 mg | INTRAMUSCULAR | Status: DC | PRN

## 2017-11-17 MED ORDER — WITCH HAZEL-GLYCERIN EX PADS: 1.0000 "application " | MEDICATED_PAD | CUTANEOUS | Status: DC | PRN

## 2017-11-17 MED ORDER — FERROUS SULFATE 325 (65 FE) MG PO TABS
325.0000 mg | ORAL_TABLET | Freq: Every day | ORAL | Status: DC
  Administered 2017-11-17 – 2017-11-18 (×2): 325 mg via ORAL
  Filled 2017-11-17 (×2): qty 1

## 2017-11-17 MED ORDER — SENNOSIDES-DOCUSATE SODIUM 8.6-50 MG PO TABS
2.0000 | ORAL_TABLET | ORAL | Status: DC
  Administered 2017-11-17: 2 via ORAL
  Filled 2017-11-17: qty 2

## 2017-11-17 MED ORDER — PRENATAL MULTIVITAMIN CH
1.0000 | ORAL_TABLET | Freq: Every day | ORAL | Status: DC
  Administered 2017-11-17: 1 via ORAL
  Filled 2017-11-17: qty 1

## 2017-11-17 MED ORDER — ONDANSETRON HCL 4 MG PO TABS: 4.0000 mg | ORAL_TABLET | ORAL | Status: DC | PRN

## 2017-11-17 NOTE — Discharge Instructions (Signed)
°Vaginal Delivery, Care After °Refer to this sheet in the next few weeks. These discharge instructions provide you with information on caring for yourself after delivery. Your caregiver may also give you specific instructions. Your treatment has been planned according to the most current medical practices available, but problems sometimes occur. Call your caregiver if you have any problems or questions after you go home. °HOME CARE INSTRUCTIONS °1. Take over-the-counter or prescription medicines only as directed by your caregiver or pharmacist. °2. Do not drink alcohol, especially if you are breastfeeding or taking medicine to relieve pain. °3. Do not smoke tobacco. °4. Continue to use good perineal care. Good perineal care includes: °1. Wiping your perineum from back to front °2. Keeping your perineum clean. °3. You can do sitz baths twice a day, to help keep this area clean °5. Do not use tampons, douche or have sex for 6 weeks °6. Shower only and avoid sitting in submerged water, aside from sitz baths °7. Wear a well-fitting bra that provides breast support. °8. Eat healthy foods. °9. Drink enough fluids to keep your urine clear or pale yellow. °10. Eat high-fiber foods such as whole grain cereals and breads, brown rice, beans, and fresh fruits and vegetables every day. These foods may help prevent or relieve constipation. °11. Avoid constipation with high fiber foods or medications, such as miralax or metamucil °12. Follow your caregiver's recommendations regarding resumption of activities such as climbing stairs, driving, lifting, exercising, or traveling. °13. Talk to your caregiver about resuming sexual activities. Resumption of sexual activities after 6 weeks is dependent upon your risk of infection, your rate of healing, and your comfort and desire to resume sexual activity. °14. Try to have someone help you with your household activities and your newborn for at least a few days after you leave the  hospital. °15. Rest as much as possible. Try to rest or take a nap when your newborn is sleeping. °16. Increase your activities gradually. °17. Keep all of your scheduled postpartum appointments. It is very important to keep your scheduled follow-up appointments. At these appointments, your caregiver will be checking to make sure that you are healing physically and emotionally. °SEEK MEDICAL CARE IF:  °· You are passing large clots from your vagina. Save any clots to show your caregiver. °· You have a foul smelling discharge from your vagina. °· You have trouble urinating. °· You are urinating frequently. °· You have pain when you urinate. °· You have a change in your bowel movements. °· You have increasing redness, pain, or swelling near your vaginal incision (episiotomy) or vaginal tear. °· You have pus draining from your episiotomy or vaginal tear. °· Your episiotomy or vaginal tear is separating. °· You have painful, hard, or reddened breasts. °· You have a severe headache. °· You have blurred vision or see spots. °· You feel sad or depressed. °· You have thoughts of hurting yourself or your newborn. °· You have questions about your care, the care of your newborn, or medicines. °· You are dizzy or light-headed. °· You have a rash. °· You have nausea or vomiting. °· You were breastfeeding and have not had a menstrual period within 12 weeks after you stopped breastfeeding. °· You are not breastfeeding and have not had a menstrual period by the 12th week after delivery. °· You have a fever of 100.5 or more °SEEK IMMEDIATE MEDICAL CARE IF:  °· You have persistent pain. °· You have chest pain. °· You have shortness   of breath. °· You faint. °· You have leg pain. °· You have stomach pain. °· Your vaginal bleeding saturates two or more sanitary pads in 1 hour. °MAKE SURE YOU:  °· Understand these instructions. °· Will watch your condition. °· Will get help right away if you are not doing well or get worse. °Document  Released: 05/23/2000 Document Revised: 10/10/2013 Document Reviewed: 01/21/2012 °ExitCare® Patient Information ©2015 [employer]. This information is not intended to replace advice given to you by your health care provider. Make sure you discuss any questions you have with your health care provider. ° °Sitz Bath °A sitz bath is a warm water bath taken in the sitting position. The water covers only the hips and butt (buttocks). We recommend using one that fits in the toilet, to help with ease of use and cleanliness. It may be used for either healing or cleaning purposes. Sitz baths are also used to relieve pain, itching, or muscle tightening (spasms). The water may contain medicine. Moist heat will help you heal and relax.  °HOME CARE  °Take 3 to 4 sitz baths a day. °18. Fill the bathtub half-full with warm water. °19. Sit in the water and open the drain a little. °20. Turn on the warm water to keep the tub half-full. Keep the water running constantly. °21. Soak in the water for 15 to 20 minutes. °22. After the sitz bath, pat the affected area dry. °GET HELP RIGHT AWAY IF: °You get worse instead of better. Stop the sitz baths if you get worse. °MAKE SURE YOU: °· Understand these instructions. °· Will watch your condition. °· Will get help right away if you are not doing well or get worse. °Document Released: 07/03/2004 Document Revised: 02/18/2012 Document Reviewed: 09/23/2010 °ExitCare® Patient Information ©2015 [employer]. This information is not intended to replace advice given to you by your health care provider. Make sure you discuss any questions you have with your health care provider. ° ° °

## 2017-11-17 NOTE — Lactation Note (Signed)
This note was copied from a baby's chart. Lactation Consultation Note  Patient Name: Michelle Odelia Gageyshona Pacholski JYNWG'NToday's Date: 11/17/2017 Reason for consult: Mother's request   Maternal Data Has patient been taught Hand Expression?: Yes Does the patient have breastfeeding experience prior to this delivery?: No  Feeding Feeding Type: Breast Fed Length of feed: 20 min  LATCH Score Latch: Repeated attempts needed to sustain latch, nipple held in mouth throughout feeding, stimulation needed to elicit sucking reflex.  Audible Swallowing: A few with stimulation  Type of Nipple: Everted at rest and after stimulation  Comfort (Breast/Nipple): Soft / non-tender  Hold (Positioning): Assistance needed to correctly position infant at breast and maintain latch.  LATCH Score: 7  Interventions Interventions: Breast feeding basics reviewed;Assisted with latch;Skin to skin  Lactation Tools Discussed/Used     Consult Status Consult Status: Follow-up Date: 11/17/17 Follow-up type: In-patient Baby was very sleepy, but after unwrapping and placing skin to skin baby fed for 20mins. Mom has lots of colostrum and audible swallows indicated milk transfer. LC is awaiting blood sugar results to finalize feeding plan for this evening since baby has only eaten 2xs and no output has been noted.   Burnadette PeterJaniya M Khriz Liddy 11/17/2017, 3:51 PM

## 2017-11-17 NOTE — Progress Notes (Signed)
PPD#0 SVD Subjective:  Awake, eating breakfast, well-appearing. Pain control is good with PRN medication. Voiding without difficulty. Tolerating a regular diet. Ambulating well.  Objective:   Blood pressure 114/72, pulse 71, temperature 98.5 F (36.9 C), temperature source Oral, resp. rate 16, height 5\' 2"  (1.575 m), weight 143 lb (64.9 kg), last menstrual period 02/17/2017, SpO2 100 %, currently breastfeeding.  General: NAD Pulmonary: no increased work of breathing Abdomen: non-distended, non-tender Uterus:  fundus firm, lochia appropriate Laceration: None Extremities: trace edema, no erythema, no tenderness, no signs of DVT  Results for orders placed or performed during the hospital encounter of 11/16/17 (from the past 72 hour(s))  CBC     Status: Abnormal   Collection Time: 11/16/17  5:53 PM  Result Value Ref Range   WBC 13.0 (H) 3.6 - 11.0 K/uL   RBC 4.16 3.80 - 5.20 MIL/uL   Hemoglobin 11.9 (L) 12.0 - 16.0 g/dL   HCT 16.135.6 09.635.0 - 04.547.0 %   MCV 85.5 80.0 - 100.0 fL   MCH 28.6 26.0 - 34.0 pg   MCHC 33.5 32.0 - 36.0 g/dL   RDW 40.913.2 81.111.5 - 91.414.5 %   Platelets 307 150 - 440 K/uL    Comment: Performed at [facility], [address] [place], [place] [address]  ABO/Rh     Status: None   Collection Time: 11/16/17  5:53 PM  Result Value Ref Range   ABO/RH(D)      AB POS Performed at [facility], [address] [place], [place] [address]     Assessment:   27 y.o. G1P1001 postpartum day #0 recovering well.  Plan:   1) Acute blood loss anemia - hemodynamically stable and asymptomatic - po ferrous sulfate  2) Blood Type --/--/AB POS Performed at [facility], [address] [place], [place] [address]  [phone](06/10 1753) /  3) Rubella 8.90 (10/16 1123) / Varicella immune / TDAP status: given 10/16/2017  4) Breastfeeding  5) Contraception: not assessed, will discuss plans tomorrow  6) Disposition: continue postpartum care.  Marcelyn BruinsJacelyn Schmid,  CNM 11/17/2017  9:48 AM

## 2017-11-17 NOTE — Progress Notes (Signed)
Period of purple cry video shown. Copy given to take home.

## 2017-11-17 NOTE — Discharge Summary (Addendum)
Physician Obstetric Discharge Summary  Patient ID: Michelle Miller MRN: [id number] DOB/AGE: 1990/11/23 26 y.o.   Date of Admission: 11/16/2017 Date of Delivery: 11/17/2017 Date of Discharge: 11/18/2017  Admitting Diagnosis: Onset of Labor at 371w0d  Secondary Diagnosis: Anemia in pregnancy  Mode of Delivery: normal spontaneous vaginal delivery 11/17/2017      Discharge Diagnosis: Term intrauterine pregnancy-delivered   Intrapartum Procedures: Atificial rupture of membranes and epidural   Post partum procedures: None  Complications: none   Brief Hospital Course  Michelle Miller is a G1P0000 who had a SVD on 11/17/2017;  for further details of this delivery, please refer to the delivery note.  Patient had an uncomplicated postpartum course.  By time of discharge on PPD#1, her pain was controlled on oral pain medications; she had appropriate lochia and was ambulating, voiding without difficulty and tolerating regular diet.  She was deemed stable for discharge to home.     Labs: CBC Latest Ref Rng & Units 11/18/2017 11/16/2017 08/27/2017  WBC 3.6 - 11.0 K/uL 11.9(H) 13.0(H) 11.4(H)  Hemoglobin 12.0 - 16.0 g/dL 10.1(L) 11.9(L) 10.0(L)  Hematocrit 35.0 - 47.0 % 30.3(L) 35.6 30.6(L)  Platelets 150 - 440 K/uL 246 307 272   AB POS Performed at [facility], [address] [place], [place] [address] RI/VI/GBS negative. TDAP UTD  Physical exam:  Blood pressure 111/67, pulse 65, temperature 98.3 F (36.8 C), temperature source Oral, resp. rate 18, height 5\' 2"  (1.575 m), weight 143 lb (64.9 kg), last menstrual period 02/17/2017, SpO2 100 %, unknown if currently breastfeeding. General: alert and no distress Lochia: appropriate Abdomen: soft, NT Uterine Fundus: firm Extremities: No evidence of DVT seen on physical exam. No lower extremity edema.  Discharge Instructions: Per After Visit Summary. Activity: Advance as tolerated. Pelvic rest for 6 weeks.  Also refer to Discharge  Instructions Diet: Regular Medications: Allergies as of 11/18/2017   No Known Allergies     Medication List    TAKE these medications   ferrous sulfate 324 (65 Fe) MG Tbec Take 1 tablet (325 mg total) by mouth 2 (two) times daily.   PROVIDA OB 20-20-1.25 MG Caps Take 1 capsule by mouth daily.      Outpatient follow up:  Follow-up Information    Farrel ConnersGutierrez, Colleen, CNM. Schedule an appointment as soon as possible for a visit.   Specialty:  Certified Nurse Midwife Why:  for 6 week postpartum check up. Contact information: [address] [place] [address] [phone]          Postpartum contraception: undecided, possibly Depo Provera or POP  Discharged Condition: good  Discharged to: home   Newborn Data: Amari Disposition:home with mother  Apgars: APGAR (1 MIN): 9   APGAR (5 MINS): 9   APGAR (10 MINS):    Baby Feeding: Breast  Oswaldo ConroyJacelyn Y Schmid, CNM 11/18/2017 10:08 AM

## 2017-11-18 LAB — RPR: RPR Ser Ql: NONREACTIVE

## 2017-11-18 LAB — CBC
HEMATOCRIT: 30.3 % — AB (ref 35.0–47.0)
HEMOGLOBIN: 10.1 g/dL — AB (ref 12.0–16.0)
MCH: 28.5 pg (ref 26.0–34.0)
MCHC: 33.5 g/dL (ref 32.0–36.0)
MCV: 85.1 fL (ref 80.0–100.0)
Platelets: 246 10*3/uL (ref 150–440)
RBC: 3.55 MIL/uL — AB (ref 3.80–5.20)
RDW: 13.2 % (ref 11.5–14.5)
WBC: 11.9 10*3/uL — ABNORMAL HIGH (ref 3.6–11.0)

## 2017-11-18 NOTE — Progress Notes (Signed)
Reviewed discharge teaching and answered questions, discharged home with partner.

## 2017-11-18 NOTE — Anesthesia Postprocedure Evaluation (Signed)
Anesthesia Post Note  Patient: Michelle Miller  Procedure(s) Performed: AN AD HOC LABOR EPIDURAL  Patient location during evaluation: Mother Baby Anesthesia Type: Epidural Level of consciousness: awake and alert and oriented Pain management: satisfactory to patient Vital Signs Assessment: post-procedure vital signs reviewed and stable Respiratory status: respiratory function stable Cardiovascular status: stable Postop Assessment: no headache, no backache, epidural receding, patient able to bend at knees, no apparent nausea or vomiting, adequate PO intake and able to ambulate Anesthetic complications: no     Last Vitals:  Vitals:   11/17/17 1941 11/17/17 2354  BP: 114/70 110/73  Pulse: 71 74  Resp: 16 16  Temp: 36.8 C 36.7 C  SpO2: 98% 100%    Last Pain:  Vitals:   11/18/17 0529  TempSrc:   PainSc: Michelle Miller                 Michelle Miller

## 2017-11-20 ENCOUNTER — Encounter: Payer: BLUE CROSS/BLUE SHIELD | Admitting: Obstetrics and Gynecology

## 2017-12-30 ENCOUNTER — Ambulatory Visit (INDEPENDENT_AMBULATORY_CARE_PROVIDER_SITE_OTHER): Payer: BLUE CROSS/BLUE SHIELD | Admitting: Obstetrics and Gynecology

## 2017-12-30 ENCOUNTER — Encounter: Payer: Self-pay | Admitting: Obstetrics and Gynecology

## 2017-12-30 VITALS — BP 100/60 | Ht 63.0 in | Wt 117.0 lb

## 2017-12-30 DIAGNOSIS — Z30011 Encounter for initial prescription of contraceptive pills: Secondary | ICD-10-CM

## 2017-12-30 MED ORDER — MEDROXYPROGESTERONE ACETATE 150 MG/ML IM SUSP: 150.0000 mg | INTRAMUSCULAR | 3 refills | Status: DC

## 2017-12-30 NOTE — Progress Notes (Signed)
  OBSTETRICS POSTPARTUM CLINIC PROGRESS NOTE  Subjective:     Michelle Miller is a 27 y.o. 981P1001 female who presents for a postpartum visit. She is 6 weeks postpartum following a Term pregnancy and delivery by Vaginal, no problems at delivery.  I have fully reviewed the prenatal and intrapartum course. Anesthesia: epidural.  Postpartum course has been complicated by uncomplicated.  Baby is feeding by Breast.  Bleeding: patient has not  resumed menses.  Bowel function is normal. Bladder function is normal.  Patient is sexually active. Contraception method desired is Depo-Provera injections.  Postpartum depression screening: negative. Edinburgh 5.  The following portions of the patient's history were reviewed and updated as appropriate: allergies, current medications, past family history, past medical history, past social history, past surgical history and problem list.  Review of Systems Pertinent items are noted in HPI.  Objective:    BP 100/60   Ht 5\' 3"  (1.6 m)   Wt 117 lb (53.1 kg)   LMP 02/17/2017   BMI 20.73 kg/m   General:  alert and no distress   Breasts:  inspection negative, no nipple discharge or bleeding, no masses or nodularity palpable  Lungs: clear to auscultation bilaterally  Heart:  regular rate and rhythm, S1, S2 normal, no murmur, click, rub or gallop  Abdomen: soft, non-tender; bowel sounds normal; no masses,  no organomegaly.     Vulva:  normal  Vagina: normal vagina, no discharge, exudate, lesion, or erythema  Cervix:  no cervical motion tenderness and no lesions  Corpus: normal size, contour, position, consistency, mobility, non-tender  Adnexa:  normal adnexa and no mass, fullness, tenderness  Rectal Exam: Not performed.          Assessment:  Post Partum Care visit 1. Encounter for BCP (birth control pills) initial prescription   - medroxyPROGESTERone (DEPO-PROVERA) 150 MG/ML injection; Inject 1 mL (150 mg total) into the muscle every 3 (three)  months.  Dispense: 1 mL; Refill: 3   Plan:  See orders and Patient Instructions Follow up in: 6 weeks or as needed.   Adelene Idlerhristanna Somalia Segler MD [facility] 12/30/17 3:20 PM

## 2017-12-31 ENCOUNTER — Ambulatory Visit (INDEPENDENT_AMBULATORY_CARE_PROVIDER_SITE_OTHER): Payer: BLUE CROSS/BLUE SHIELD

## 2017-12-31 DIAGNOSIS — Z30013 Encounter for initial prescription of injectable contraceptive: Secondary | ICD-10-CM

## 2017-12-31 DIAGNOSIS — Z3202 Encounter for pregnancy test, result negative: Secondary | ICD-10-CM | POA: Diagnosis not present

## 2017-12-31 DIAGNOSIS — Z3042 Encounter for surveillance of injectable contraceptive: Secondary | ICD-10-CM | POA: Diagnosis not present

## 2017-12-31 DIAGNOSIS — N911 Secondary amenorrhea: Secondary | ICD-10-CM

## 2017-12-31 LAB — POCT URINE PREGNANCY: Preg Test, Ur: NEGATIVE

## 2017-12-31 MED ORDER — MEDROXYPROGESTERONE ACETATE 150 MG/ML IM SUSP
150.0000 mg | Freq: Once | INTRAMUSCULAR | Status: AC
  Administered 2017-12-31: 150 mg via INTRAMUSCULAR

## 2018-03-25 ENCOUNTER — Ambulatory Visit: Payer: BLUE CROSS/BLUE SHIELD

## 2018-03-25 ENCOUNTER — Ambulatory Visit (INDEPENDENT_AMBULATORY_CARE_PROVIDER_SITE_OTHER): Payer: BLUE CROSS/BLUE SHIELD

## 2018-03-25 DIAGNOSIS — Z3042 Encounter for surveillance of injectable contraceptive: Secondary | ICD-10-CM

## 2018-03-25 MED ORDER — MEDROXYPROGESTERONE ACETATE 150 MG/ML IM SUSP
150.0000 mg | Freq: Once | INTRAMUSCULAR | Status: AC
  Administered 2018-03-25: 150 mg via INTRAMUSCULAR

## 2018-06-17 ENCOUNTER — Ambulatory Visit: Payer: BLUE CROSS/BLUE SHIELD

## 2018-06-21 ENCOUNTER — Ambulatory Visit: Payer: BLUE CROSS/BLUE SHIELD

## 2018-06-21 ENCOUNTER — Ambulatory Visit (INDEPENDENT_AMBULATORY_CARE_PROVIDER_SITE_OTHER): Payer: BLUE CROSS/BLUE SHIELD

## 2018-06-21 DIAGNOSIS — Z3042 Encounter for surveillance of injectable contraceptive: Secondary | ICD-10-CM | POA: Diagnosis not present

## 2018-06-21 MED ORDER — MEDROXYPROGESTERONE ACETATE 150 MG/ML IM SUSP
150.0000 mg | Freq: Once | INTRAMUSCULAR | Status: AC
  Administered 2018-06-21: 150 mg via INTRAMUSCULAR

## 2018-09-13 ENCOUNTER — Other Ambulatory Visit: Payer: Self-pay

## 2018-09-13 ENCOUNTER — Ambulatory Visit (INDEPENDENT_AMBULATORY_CARE_PROVIDER_SITE_OTHER): Payer: BLUE CROSS/BLUE SHIELD

## 2018-09-13 DIAGNOSIS — Z3042 Encounter for surveillance of injectable contraceptive: Secondary | ICD-10-CM | POA: Diagnosis not present

## 2018-09-13 MED ORDER — MEDROXYPROGESTERONE ACETATE 150 MG/ML IM SUSP
150.0000 mg | Freq: Once | INTRAMUSCULAR | Status: AC
  Administered 2018-09-13: 150 mg via INTRAMUSCULAR

## 2018-09-13 NOTE — Progress Notes (Signed)
Patient presents for Depo Provera injection within dates. Given IM Right Deltoid per patient request. Patient tolerated well.

## 2018-12-06 ENCOUNTER — Ambulatory Visit (INDEPENDENT_AMBULATORY_CARE_PROVIDER_SITE_OTHER): Payer: BC Managed Care – PPO

## 2018-12-06 ENCOUNTER — Telehealth: Payer: Self-pay | Admitting: Obstetrics and Gynecology

## 2018-12-06 ENCOUNTER — Ambulatory Visit: Payer: BLUE CROSS/BLUE SHIELD

## 2018-12-06 ENCOUNTER — Other Ambulatory Visit: Payer: Self-pay

## 2018-12-06 ENCOUNTER — Other Ambulatory Visit: Payer: Self-pay | Admitting: Obstetrics and Gynecology

## 2018-12-06 DIAGNOSIS — Z30011 Encounter for initial prescription of contraceptive pills: Secondary | ICD-10-CM

## 2018-12-06 DIAGNOSIS — Z3042 Encounter for surveillance of injectable contraceptive: Secondary | ICD-10-CM

## 2018-12-06 MED ORDER — MEDROXYPROGESTERONE ACETATE 150 MG/ML IM SUSP
150.0000 mg | Freq: Once | INTRAMUSCULAR | Status: AC
Start: 2018-12-06 — End: 2018-12-06
  Administered 2018-12-06: 150 mg via INTRAMUSCULAR

## 2018-12-06 MED ORDER — MEDROXYPROGESTERONE ACETATE 150 MG/ML IM SUSP: 150.0000 mg | INTRAMUSCULAR | 0 refills | Status: DC

## 2018-12-06 NOTE — Progress Notes (Signed)
Patient presents for Depo Provera injection within dates. Given IM Left Deltoid per patient request. Patient tolerated well.

## 2018-12-06 NOTE — Telephone Encounter (Signed)
Patient is schedule for Depo injection today. Patient needs refill sent in. Please advise . Patient is schedule 8:30 on 01/03/19 with Dr. Gilman Schmidt for annual

## 2018-12-06 NOTE — Telephone Encounter (Signed)
[facility] [address]

## 2019-01-03 ENCOUNTER — Encounter: Payer: Self-pay | Admitting: Obstetrics and Gynecology

## 2019-01-03 ENCOUNTER — Other Ambulatory Visit: Payer: Self-pay

## 2019-01-03 ENCOUNTER — Ambulatory Visit (INDEPENDENT_AMBULATORY_CARE_PROVIDER_SITE_OTHER): Payer: BC Managed Care – PPO | Admitting: Obstetrics and Gynecology

## 2019-01-03 VITALS — BP 120/70 | HR 93 | Ht 63.0 in | Wt 121.0 lb

## 2019-01-03 DIAGNOSIS — Z01419 Encounter for gynecological examination (general) (routine) without abnormal findings: Secondary | ICD-10-CM

## 2019-01-03 DIAGNOSIS — Z30011 Encounter for initial prescription of contraceptive pills: Secondary | ICD-10-CM

## 2019-01-03 DIAGNOSIS — Z0000 Encounter for general adult medical examination without abnormal findings: Secondary | ICD-10-CM

## 2019-01-03 DIAGNOSIS — Z113 Encounter for screening for infections with a predominantly sexual mode of transmission: Secondary | ICD-10-CM

## 2019-01-03 MED ORDER — MEDROXYPROGESTERONE ACETATE 150 MG/ML IM SUSP: 150.0000 mg | INTRAMUSCULAR | 0 refills | Status: DC

## 2019-01-03 NOTE — Addendum Note (Signed)
Addended by: Adrian Prows on: 01/03/2019 04:42 PM   Modules accepted: Orders

## 2019-01-03 NOTE — Progress Notes (Signed)
Gynecology Annual Exam  PCP: Patient, No Pcp Per  Chief Complaint:  Chief Complaint  Patient presents with  . Gynecologic Exam    History of Present Illness: Patient is a 28 y.o. G1P1001 presents for annual exam. The patient has no complaints today.   LMP: No LMP recorded. Patient has had an injection. Menarche:not applicable Average Interval:none Duration of flow: 0 days Heavy Menses: no Clots: no Intermenstrual Bleeding: no Postcoital Bleeding: no Dysmenorrhea: no  The patient is sexually active. She currently uses Depo-Provera injections for contraception. She denies dyspareunia.  The patient does perform self breast exams.  There is no notable family history of breast or ovarian cancer in her family.  The patient wears seatbelts: yes.  The patient has regular exercise: yes.    The patient denies current symptoms of depression.    Review of Systems: ROS  Past Medical History:  Past Medical History:  Diagnosis Date  . Medical history non-contributory     Past Surgical History:  Past Surgical History:  Procedure Laterality Date  . NO PAST SURGERIES      Gynecologic History:  No LMP recorded. Patient has had an injection. Contraception: Depo-Provera injections Last Pap: Results were: NIL 2018    Obstetric History: G1P1001  Family History:  Family History  Problem Relation Age of Onset  . Diabetes Mother     Social History:  Social History   Socioeconomic History  . Marital status: Single    Spouse name: Not on file  . Number of children: Not on file  . Years of education: Not on file  . Highest education level: Not on file  Occupational History  . Not on file  Social Needs  . Financial resource strain: Not on file  . Food insecurity    Worry: Not on file    Inability: Not on file  . Transportation needs    Medical: Not on file    Non-medical: Not on file  Tobacco Use  . Smoking status: Never Smoker  . Smokeless tobacco: Never Used   Substance and Sexual Activity  . Alcohol use: Yes    Comment: social  . Drug use: No  . Sexual activity: Yes    Birth control/protection: Inserts  Lifestyle  . Physical activity    Days per week: Not on file    Minutes per session: Not on file  . Stress: Not on file  Relationships  . Social Musicianconnections    Talks on phone: Not on file    Gets together: Not on file    Attends religious service: Not on file    Active member of club or organization: Not on file    Attends meetings of clubs or organizations: Not on file    Relationship status: Not on file  . Intimate partner violence    Fear of current or ex partner: Not on file    Emotionally abused: Not on file    Physically abused: Not on file    Forced sexual activity: Not on file  Other Topics Concern  . Not on file  Social History Narrative  . Not on file    Allergies:  No Known Allergies  Medications: Prior to Admission medications   Medication Sig Start Date End Date Taking? Authorizing Provider  ferrous sulfate 324 (65 Fe) MG TBEC Take 1 tablet (325 mg total) by mouth 2 (two) times daily. 02/18/17  Yes Conard NovakJackson, Stephen D, MD  medroxyPROGESTERone (DEPO-PROVERA) 150 MG/ML injection Inject 1  mL (150 mg total) into the muscle every 3 (three) months. 01/03/19 04/03/19 Yes Malyah Ohlrich R, MD  Prenat w/o A Vit-FeFum-FePo-FA (PROVIDA OB) 20-20-1.25 MG CAPS Take 1 capsule by mouth daily. Patient not taking: Reported on 12/30/2017 08/21/17   Will Bonnet, MD    Physical Exam Vitals: Blood pressure 120/70, pulse 93, height 5\' 3"  (1.6 m), weight 121 lb (54.9 kg), unknown if currently breastfeeding.  General: NAD HEENT: normocephalic, anicteric Thyroid: no enlargement, no palpable nodules Pulmonary: No increased work of breathing, CTAB Cardiovascular: RRR, distal pulses 2+ Breast: Breast symmetrical, no tenderness, no palpable nodules or masses, no skin or nipple retraction present, no nipple discharge.  No axillary  or supraclavicular lymphadenopathy. Abdomen: NABS, soft, non-tender, non-distended.  Umbilicus without lesions.  No hepatomegaly, splenomegaly or masses palpable. No evidence of hernia  Genitourinary:  External: Normal external female genitalia.  Normal urethral meatus, normal Bartholin's and Skene's glands.    Vagina: Normal vaginal mucosa, no evidence of prolapse.    Cervix: Grossly normal in appearance, no bleeding  Uterus: Non-enlarged, mobile, normal contour.  No CMT  Adnexa: ovaries non-enlarged, no adnexal masses  Rectal: deferred  Lymphatic: no evidence of inguinal lymphadenopathy Extremities: no edema, erythema, or tenderness Neurologic: Grossly intact Psychiatric: mood appropriate, affect full  Female chaperone present for pelvic and breast  portions of the physical exam    Assessment: 28 y.o. G1P1001 routine annual exam  Plan: Problem List Items Addressed This Visit    None    Visit Diagnoses    Healthcare maintenance    -  Primary   Encounter for BCP (birth control pills) initial prescription       Relevant Medications   medroxyPROGESTERone (DEPO-PROVERA) 150 MG/ML injection   Screening examination for STD (sexually transmitted disease)       Normal breast exam       Normal pelvic exam          1) 4) Gardasil Series discussed and if applicable offered to patient - Patient has not previously completed 3 shot series. - Declines HPV vaccination today   2) STI screening  wasoffered and accepted  3)  ASCCP guidelines and rational discussed.  Patient opts for every 3 years screening interval  4) Contraception - the patient is currently using  Depo-Provera injections.  She is happy with her current form of contraception and plans to continue We discussed safe sex practices to reduce her furture risk of STI's.    5) Return in about 1 year (around 01/03/2020) for Annual 1 year, Depo injection every 12 weeks. Last injection June 2019.  Adrian Prows MD [facility] 01/03/2019 9:24 AM

## 2019-01-04 DIAGNOSIS — Z113 Encounter for screening for infections with a predominantly sexual mode of transmission: Secondary | ICD-10-CM | POA: Diagnosis not present

## 2019-01-10 LAB — NUSWAB VAGINITIS PLUS (VG+)
Candida albicans, NAA: NEGATIVE
Candida glabrata, NAA: NEGATIVE
Chlamydia trachomatis, NAA: NEGATIVE
Neisseria gonorrhoeae, NAA: NEGATIVE
Trich vag by NAA: NEGATIVE

## 2019-01-10 NOTE — Progress Notes (Signed)
Please call with normal swab result.  Thank you,  Dr. Gilman Schmidt

## 2019-02-28 ENCOUNTER — Other Ambulatory Visit: Payer: Self-pay

## 2019-02-28 ENCOUNTER — Ambulatory Visit (INDEPENDENT_AMBULATORY_CARE_PROVIDER_SITE_OTHER): Payer: BC Managed Care – PPO

## 2019-02-28 DIAGNOSIS — Z3042 Encounter for surveillance of injectable contraceptive: Secondary | ICD-10-CM | POA: Diagnosis not present

## 2019-02-28 MED ORDER — MEDROXYPROGESTERONE ACETATE 150 MG/ML IM SUSP
150.0000 mg | Freq: Once | INTRAMUSCULAR | Status: AC
  Administered 2019-02-28: 150 mg via INTRAMUSCULAR

## 2019-05-22 ENCOUNTER — Other Ambulatory Visit: Payer: Self-pay | Admitting: Obstetrics and Gynecology

## 2019-05-22 DIAGNOSIS — Z30011 Encounter for initial prescription of contraceptive pills: Secondary | ICD-10-CM

## 2019-05-23 ENCOUNTER — Ambulatory Visit: Payer: BC Managed Care – PPO

## 2019-05-23 NOTE — Telephone Encounter (Signed)
Chart reviewed. Pt had AE 01/03/2019. Rx was sent that day for 1 injection only no refills. Refill processed as #1 w/2 refills to get to next AE due 12/2019.

## 2019-05-23 NOTE — Telephone Encounter (Signed)
Pt called back and said rx was still not at pharmacy and had to leave for work. [facility] so [facility] can [facility] after work. Pt has moved apt to tomorrow morning.

## 2019-05-24 ENCOUNTER — Other Ambulatory Visit: Payer: Self-pay

## 2019-05-24 ENCOUNTER — Ambulatory Visit (INDEPENDENT_AMBULATORY_CARE_PROVIDER_SITE_OTHER): Payer: BC Managed Care – PPO

## 2019-05-24 DIAGNOSIS — Z3042 Encounter for surveillance of injectable contraceptive: Secondary | ICD-10-CM | POA: Diagnosis not present

## 2019-05-24 MED ORDER — MEDROXYPROGESTERONE ACETATE 150 MG/ML IM SUSP
150.0000 mg | Freq: Once | INTRAMUSCULAR | Status: AC
  Administered 2019-05-24: 150 mg via INTRAMUSCULAR

## 2019-05-24 NOTE — Progress Notes (Signed)
Pt here for depo which was given IM left deltoid.  NDC# [address]-1 

## 2019-08-16 ENCOUNTER — Ambulatory Visit: Payer: BC Managed Care – PPO

## 2019-08-18 ENCOUNTER — Other Ambulatory Visit: Payer: Self-pay

## 2019-08-18 ENCOUNTER — Ambulatory Visit (INDEPENDENT_AMBULATORY_CARE_PROVIDER_SITE_OTHER): Payer: BC Managed Care – PPO

## 2019-08-18 DIAGNOSIS — Z3042 Encounter for surveillance of injectable contraceptive: Secondary | ICD-10-CM | POA: Diagnosis not present

## 2019-08-18 MED ORDER — MEDROXYPROGESTERONE ACETATE 150 MG/ML IM SUSP
150.0000 mg | Freq: Once | INTRAMUSCULAR | Status: AC
  Administered 2019-08-18: 150 mg via INTRAMUSCULAR

## 2019-09-06 DIAGNOSIS — R300 Dysuria: Secondary | ICD-10-CM | POA: Diagnosis not present

## 2019-09-06 DIAGNOSIS — Z113 Encounter for screening for infections with a predominantly sexual mode of transmission: Secondary | ICD-10-CM | POA: Diagnosis not present

## 2019-11-02 ENCOUNTER — Telehealth: Payer: Self-pay

## 2019-11-02 NOTE — Telephone Encounter (Signed)
Spoke w/pharmacy to verify that 1 refill is available. They are filling for patient now.

## 2019-11-02 NOTE — Telephone Encounter (Signed)
LMVM to notify patient. 

## 2019-11-02 NOTE — Telephone Encounter (Signed)
Patient inquiring if she has a refill or if she needs a refill on her depo. She believes the last time she filled it the pharmacy told her it was her last refill. UQ#[phone]

## 2019-11-03 ENCOUNTER — Other Ambulatory Visit: Payer: Self-pay

## 2019-11-03 ENCOUNTER — Ambulatory Visit (INDEPENDENT_AMBULATORY_CARE_PROVIDER_SITE_OTHER): Payer: BC Managed Care – PPO

## 2019-11-03 ENCOUNTER — Telehealth: Payer: Self-pay | Admitting: Obstetrics and Gynecology

## 2019-11-03 DIAGNOSIS — Z30011 Encounter for initial prescription of contraceptive pills: Secondary | ICD-10-CM

## 2019-11-03 DIAGNOSIS — Z3042 Encounter for surveillance of injectable contraceptive: Secondary | ICD-10-CM

## 2019-11-03 MED ORDER — MEDROXYPROGESTERONE ACETATE 150 MG/ML IM SUSP
150.0000 mg | Freq: Once | INTRAMUSCULAR | Status: AC
  Administered 2019-11-03: 150 mg via INTRAMUSCULAR

## 2019-11-03 MED ORDER — MEDROXYPROGESTERONE ACETATE 150 MG/ML IM SUSP: 150.0000 mg | INTRAMUSCULAR | 0 refills | Status: DC

## 2019-11-03 NOTE — Telephone Encounter (Signed)
Patient is scheduled for annual/depo 8/12 with CS, will need refill on depo for that visit.  [facility] [address].

## 2019-11-03 NOTE — Telephone Encounter (Signed)
[facility]  

## 2020-01-19 ENCOUNTER — Ambulatory Visit: Payer: BC Managed Care – PPO | Admitting: Obstetrics and Gynecology

## 2020-01-26 ENCOUNTER — Ambulatory Visit: Payer: BC Managed Care – PPO | Admitting: Obstetrics and Gynecology

## 2020-02-04 ENCOUNTER — Other Ambulatory Visit: Payer: Self-pay | Admitting: Obstetrics and Gynecology

## 2020-02-04 DIAGNOSIS — Z30011 Encounter for initial prescription of contraceptive pills: Secondary | ICD-10-CM

## 2020-02-06 ENCOUNTER — Other Ambulatory Visit: Payer: Self-pay

## 2020-02-06 ENCOUNTER — Ambulatory Visit (INDEPENDENT_AMBULATORY_CARE_PROVIDER_SITE_OTHER): Payer: BC Managed Care – PPO

## 2020-02-06 DIAGNOSIS — Z3042 Encounter for surveillance of injectable contraceptive: Secondary | ICD-10-CM

## 2020-02-06 MED ORDER — MEDROXYPROGESTERONE ACETATE 150 MG/ML IM SUSP
150.0000 mg | Freq: Once | INTRAMUSCULAR | Status: AC
  Administered 2020-02-06: 150 mg via INTRAMUSCULAR

## 2020-02-08 DIAGNOSIS — R87612 Low grade squamous intraepithelial lesion on cytologic smear of cervix (LGSIL): Secondary | ICD-10-CM

## 2020-02-08 HISTORY — DX: Low grade squamous intraepithelial lesion on cytologic smear of cervix (LGSIL): R87.612

## 2020-02-22 NOTE — Progress Notes (Signed)
PCP:  Patient, No Pcp Per   Chief Complaint  Patient presents with  . Gynecologic Exam     HPI:      [name] is a 29 y.o. G1P1001 whose LMP was No LMP recorded. Patient has had an injection., presents today for her annual examination.  Her menses are absent due to depo. Dysmenorrhea rarelyl. She does not have intermenstrual bleeding.  Sex activity: single partner, contraception - Depo-Provera injections. May want to conceive early next yr. If so, will stop depo 11/21 or 1/22. May want OCPs in short term till ready to conceive. No hx of HTN, DVTs, migraines with aura. Did OCPs in past but had trouble doing daily.  Last Pap: 01/13/17  Results were: no abnormalities   There is no FH of breast cancer. There is no FH of ovarian cancer. The patient does do self-breast exams.  Tobacco use: The patient denies current or previous tobacco use. Alcohol use: none No drug use.  Exercise: moderately active  She does get adequate calcium but not Vitamin D in her diet.    Upstream - 02/23/20 1110      Pregnancy Intention Screening   Does the patient want to become pregnant in the next year? Yes    Does the patient's partner want to become pregnant in the next year? Yes    Would the patient like to discuss contraceptive options today? Yes      Contraception Wrap Up   Current Method Hormonal Injection          The pregnancy intention screening data noted above was reviewed. Potential methods of contraception were discussed. The patient elected to proceed with Hormonal Injection.    Past Medical History:  Diagnosis Date  . Medical history non-contributory     Past Surgical History:  Procedure Laterality Date  . NO PAST SURGERIES      Family History  Problem Relation Age of Onset  . Diabetes Mother     Social History   Socioeconomic History  . Marital status: Single    Spouse name: Not on file  . Number of children: Not on file  . Years of education: Not on file   . Highest education level: Not on file  Occupational History  . Not on file  Tobacco Use  . Smoking status: Never Smoker  . Smokeless tobacco: Never Used  Vaping Use  . Vaping Use: Never used  Substance and Sexual Activity  . Alcohol use: Yes    Comment: social  . Drug use: No  . Sexual activity: Yes    Birth control/protection: Injection  Other Topics Concern  . Not on file  Social History Narrative  . Not on file   Social Determinants of Health   Financial Resource Strain:   . Difficulty of Paying Living Expenses: Not on file  Food Insecurity:   . Worried About Programme researcher, broadcasting/film/video in the Last Year: Not on file  . Ran Out of Food in the Last Year: Not on file  Transportation Needs:   . Lack of Transportation (Medical): Not on file  . Lack of Transportation (Non-Medical): Not on file  Physical Activity:   . Days of Exercise per Week: Not on file  . Minutes of Exercise per Session: Not on file  Stress:   . Feeling of Stress : Not on file  Social Connections:   . Frequency of Communication with Friends and Family: Not on file  . Frequency of Social Gatherings  with Friends and Family: Not on file  . Attends Religious Services: Not on file  . Active Member of Clubs or Organizations: Not on file  . Attends Banker Meetings: Not on file  . Marital Status: Not on file  Intimate Partner Violence:   . Fear of Current or Ex-Partner: Not on file  . Emotionally Abused: Not on file  . Physically Abused: Not on file  . Sexually Abused: Not on file     Current Outpatient Medications:  .  ferrous sulfate 324 (65 Fe) MG TBEC, Take 1 tablet (325 mg total) by mouth 2 (two) times daily., Disp: 30 tablet, Rfl: 3 .  medroxyPROGESTERone (DEPO-PROVERA) 150 MG/ML injection, Inject 1 mL (150 mg total) into the muscle every 3 (three) months., Disp: 1 mL, Rfl: 0     ROS:  Review of Systems  Constitutional: Negative for fatigue, fever and unexpected weight change.    Respiratory: Negative for cough, shortness of breath and wheezing.   Cardiovascular: Negative for chest pain, palpitations and leg swelling.  Gastrointestinal: Negative for blood in stool, constipation, diarrhea, nausea and vomiting.  Endocrine: Negative for cold intolerance, heat intolerance and polyuria.  Genitourinary: Negative for dyspareunia, dysuria, flank pain, frequency, genital sores, hematuria, menstrual problem, pelvic pain, urgency, vaginal bleeding, vaginal discharge and vaginal pain.  Musculoskeletal: Negative for back pain, joint swelling and myalgias.  Skin: Negative for rash.  Neurological: Negative for dizziness, syncope, light-headedness, numbness and headaches.  Hematological: Negative for adenopathy.  Psychiatric/Behavioral: Negative for agitation, confusion, sleep disturbance and suicidal ideas. The patient is not nervous/anxious.   BREAST: No symptoms   Objective: BP 100/80   Ht 5\' 3"  (1.6 m)   Wt 133 lb (60.3 kg)   Breastfeeding No   BMI 23.56 kg/m    Physical Exam Constitutional:      Appearance: She is well-developed.  Genitourinary:     Vulva, vagina, cervix, uterus, right adnexa and left adnexa normal.     No vulval lesion or tenderness noted.     No vaginal discharge, erythema or tenderness.     No cervical polyp.     Uterus is not enlarged or tender.     No right or left adnexal mass present.     Right adnexa not tender.     Left adnexa not tender.  Neck:     Thyroid: No thyromegaly.  Cardiovascular:     Rate and Rhythm: Normal rate and regular rhythm.     Heart sounds: Normal heart sounds. No murmur heard.   Pulmonary:     Effort: Pulmonary effort is normal.     Breath sounds: Normal breath sounds.  Chest:     Breasts:        Right: No mass, nipple discharge, skin change or tenderness.        Left: No mass, nipple discharge, skin change or tenderness.  Abdominal:     Palpations: Abdomen is soft.     Tenderness: There is no abdominal  tenderness. There is no guarding.  Musculoskeletal:        General: Normal range of motion.     Cervical back: Normal range of motion.  Neurological:     General: No focal deficit present.     Mental Status: She is alert and oriented to person, place, and time.     Cranial Nerves: No cranial nerve deficit.  Skin:    General: Skin is warm and dry.  Psychiatric:  Mood and Affect: Mood normal.        Behavior: Behavior normal.        Thought Content: Thought content normal.        Judgment: Judgment normal.  Vitals reviewed.     Assessment/Plan: Encounter for annual routine gynecological examination  Cervical cancer screening - Plan: Cytology - PAP  Encounter for surveillance of injectable contraceptive - Plan: medroxyPROGESTERone (DEPO-PROVERA) 150 MG/ML injection; Depo RF for 1 more dose. Pt will then want to stop to conceive. F/u for OCP Rx if wants a short term BC while depo getting out of system. Cont ca/VIt D.   Meds ordered this encounter  Medications  . medroxyPROGESTERone (DEPO-PROVERA) 150 MG/ML injection    Sig: Inject 1 mL (150 mg total) into the muscle every 3 (three) months.    Dispense:  1 mL    Refill:  0    Order Specific Question:   Supervising Provider    Answer:   Nadara Mustard [[id number]]             GYN counsel family planning choices, adequate intake of calcium and vitamin D, diet and exercise     F/U  Return in about 1 year (around 02/22/2021).  [place] B. Rama Sorci, PA-C 02/23/2020 11:52 AM

## 2020-02-23 ENCOUNTER — Encounter: Payer: Self-pay | Admitting: Obstetrics and Gynecology

## 2020-02-23 ENCOUNTER — Ambulatory Visit (INDEPENDENT_AMBULATORY_CARE_PROVIDER_SITE_OTHER): Payer: BC Managed Care – PPO | Admitting: Obstetrics and Gynecology

## 2020-02-23 ENCOUNTER — Other Ambulatory Visit (HOSPITAL_COMMUNITY)
Admission: RE | Admit: 2020-02-23 | Discharge: 2020-02-23 | Disposition: A | Discharge status: Home / Self Care | Payer: BC Managed Care – PPO | Source: Ambulatory Visit | Attending: Obstetrics and Gynecology | Admitting: Obstetrics and Gynecology

## 2020-02-23 ENCOUNTER — Other Ambulatory Visit: Payer: Self-pay

## 2020-02-23 VITALS — BP 100/80 | Ht 63.0 in | Wt 133.0 lb

## 2020-02-23 DIAGNOSIS — Z124 Encounter for screening for malignant neoplasm of cervix: Secondary | ICD-10-CM | POA: Diagnosis not present

## 2020-02-23 DIAGNOSIS — Z3042 Encounter for surveillance of injectable contraceptive: Secondary | ICD-10-CM

## 2020-02-23 DIAGNOSIS — Z01419 Encounter for gynecological examination (general) (routine) without abnormal findings: Secondary | ICD-10-CM

## 2020-02-23 DIAGNOSIS — R87612 Low grade squamous intraepithelial lesion on cytologic smear of cervix (LGSIL): Secondary | ICD-10-CM | POA: Diagnosis not present

## 2020-02-23 MED ORDER — MEDROXYPROGESTERONE ACETATE 150 MG/ML IM SUSP: 150.0000 mg | INTRAMUSCULAR | 0 refills | Status: DC

## 2020-02-23 NOTE — Patient Instructions (Signed)
I value your feedback and entrusting us with your care. If you get a  patient survey, I would appreciate you taking the time to let us know about your experience today. Thank you!  As of May 19, 2019, your lab results will be released to your MyChart immediately, before I even have a chance to see them. Please give me time to review them and contact you if there are any abnormalities. Thank you for your patience.  

## 2020-02-27 LAB — CYTOLOGY - PAP

## 2020-02-28 ENCOUNTER — Telehealth: Payer: Self-pay | Admitting: Obstetrics and Gynecology

## 2020-02-28 NOTE — Telephone Encounter (Signed)
Patient is calling for labs results. Please advise. Patient is at work and would like it if you would leave a detailed message. Thank you!

## 2020-02-28 NOTE — Telephone Encounter (Signed)
Patient is calling back to speak with ABC. Patient aware ABC is in a room with a patient will send message back

## 2020-02-28 NOTE — Telephone Encounter (Signed)
Done

## 2020-02-28 NOTE — Telephone Encounter (Signed)
LM with results and need for colpo. Pt to call back with questions/to sched colpo

## 2020-03-22 ENCOUNTER — Other Ambulatory Visit: Payer: Self-pay

## 2020-03-22 ENCOUNTER — Other Ambulatory Visit (HOSPITAL_COMMUNITY)
Admission: RE | Admit: 2020-03-22 | Discharge: 2020-03-22 | Disposition: A | Discharge status: Home / Self Care | Payer: BC Managed Care – PPO | Source: Ambulatory Visit | Attending: Obstetrics and Gynecology | Admitting: Obstetrics and Gynecology

## 2020-03-22 ENCOUNTER — Ambulatory Visit (INDEPENDENT_AMBULATORY_CARE_PROVIDER_SITE_OTHER): Payer: BC Managed Care – PPO | Admitting: Obstetrics and Gynecology

## 2020-03-22 VITALS — BP 114/72 | Ht 63.0 in | Wt 134.4 lb

## 2020-03-22 DIAGNOSIS — R87612 Low grade squamous intraepithelial lesion on cytologic smear of cervix (LGSIL): Secondary | ICD-10-CM | POA: Diagnosis not present

## 2020-03-22 DIAGNOSIS — N870 Mild cervical dysplasia: Secondary | ICD-10-CM | POA: Diagnosis not present

## 2020-03-22 NOTE — Progress Notes (Signed)
Obstetrics & Gynecology Office Visit   Chief Complaint:  Chief Complaint  Patient presents with  . Procedure    History of Present Illness:Michelle Miller is a 29 y.o. woman who presents today for continued surveillance for history of dysplasia. Last pap obtained on 02/23/2020 revealed LGSIL.  Prior pap 01/24/2020 LGSIL pap  Review of Systems: Review of Systems  Constitutional: Negative.   Genitourinary: Negative.   Skin: Negative.      Past Medical History:  Patient Active Problem List   Diagnosis Date Noted  . Postpartum care following vaginal delivery 11/16/2017    Past Surgical History:  Patient Active Problem List   Diagnosis Date Noted  . Postpartum care following vaginal delivery 11/16/2017    Gynecologic History: No LMP recorded. Patient has had an injection.  Obstetric History: G1P1001  Family History:  Family History  Problem Relation Age of Onset  . Diabetes Mother     Social History:  Social History   Socioeconomic History  . Marital status: Single    Spouse name: Not on file  . Number of children: Not on file  . Years of education: Not on file  . Highest education level: Not on file  Occupational History  . Not on file  Tobacco Use  . Smoking status: Never Smoker  . Smokeless tobacco: Never Used  Vaping Use  . Vaping Use: Never used  Substance and Sexual Activity  . Alcohol use: Yes    Comment: social  . Drug use: No  . Sexual activity: Yes    Birth control/protection: Injection  Other Topics Concern  . Not on file  Social History Narrative  . Not on file   Social Determinants of Health   Financial Resource Strain:   . Difficulty of Paying Living Expenses: Not on file  Food Insecurity:   . Worried About Programme researcher, broadcasting/film/video in the Last Year: Not on file  . Ran Out of Food in the Last Year: Not on file  Transportation Needs:   . Lack of Transportation (Medical): Not on file  . Lack of Transportation (Non-Medical): Not on  file  Physical Activity:   . Days of Exercise per Week: Not on file  . Minutes of Exercise per Session: Not on file  Stress:   . Feeling of Stress : Not on file  Social Connections:   . Frequency of Communication with Friends and Family: Not on file  . Frequency of Social Gatherings with Friends and Family: Not on file  . Attends Religious Services: Not on file  . Active Member of Clubs or Organizations: Not on file  . Attends Banker Meetings: Not on file  . Marital Status: Not on file  Intimate Partner Violence:   . Fear of Current or Ex-Partner: Not on file  . Emotionally Abused: Not on file  . Physically Abused: Not on file  . Sexually Abused: Not on file    Allergies:  No Known Allergies  Medications: Prior to Admission medications   Medication Sig Start Date End Date Taking? Authorizing Provider  medroxyPROGESTERone (DEPO-PROVERA) 150 MG/ML injection Inject 1 mL (150 mg total) into the muscle every 3 (three) months. 02/23/20 05/23/20 Yes Copland, Ilona Sorrel, PA-C    Physical Exam Vitals:  Vitals:   03/22/20 1119  BP: 114/72   No LMP recorded. Patient has had an injection.  General: NAD, well nourished, appears stated age HEENT: normocephalic, anicteric Pulmonary: No increased work of breathing Genitourinary:  External: Normal external female genitalia.  Normal urethral meatus, normal  Bartholin's and Skene's glands.    Vagina: Normal vaginal mucosa, no evidence of prolapse.    Cervix: Grossly normal in appearance, no bleeding Extremities: no edema, erythema, or tenderness Neurologic: Grossly intact Psychiatric: mood appropriate, affect full  Female chaperone present for pelvic and breast  portions of the physical exam  GYNECOLOGY CLINIC COLPOSCOPY PROCEDURE NOTE  29 y.o. G1P1001 here for colposcopy for low-grade squamous intraepithelial neoplasia (LGSIL - encompassing HPV,mild dysplasia,CIN I)  pap smear on 02/23/2020. Discussed underlying role for  HPV infection in the development of cervical dysplasia, its natural history and progression/regression, need for surveillance.  Is the patient  pregnant: No LMP: No LMP recorded. Patient has had an injection. Smoking status:  reports that she has never smoked. She has never used smokeless tobacco. Contraception: Depo-Provera injections Number current sexual partners:  1 High risk partner:No Future fertility desired:  Yes interested in conceiving in the next year  Patient given informed consent, signed copy in the chart, time out was performed.  The patient was position in dorsal lithotomy position. Speculum was placed the cervix was visualized.   After application of acetic acid colposcopic inspection of the cervix was undertaken.   Colposcopy adequate, full visualization of transformation zone: Yes acetowhite lesion(s) noted at 1 and 5 o'clock; corresponding biopsies obtained.   ECC specimen obtained:  Yes  All specimens were labeled and sent to pathology.   Patient was given post procedure instructions.  Will follow up pathology and manage accordingly.  Routine preventative health maintenance measures emphasized.  Assessment: 29 y.o. G1P1001 follow up for LGSIL pap  Plan: Problem List Items Addressed This Visit    None    Visit Diagnoses    LGSIL on Pap smear of cervix    -  Primary   Relevant Orders   Surgical pathology      - Follow up pap smear from today.   - I had a lengthly discussion with Michelle Miller  regarding the cause of dysplasia of the lower genital tract (including immunosuppression in the setting of HPV exposure and tobacco exposure). I explained the potential for progression to invasive malignancy, the recurrent nature of these lesions (and the need for close continued followup). Results of today's pap will dictate need for further evaluation and follow up per ASCCP guidelines..  - We discussed that 80% of the population will have exposure to human papilloma  virus (HPV) during their lifetime.  HPV is a large group of viruses, and are also the causative virus for common warts and genital warts.  The pap smear tests for 13 high risk HPV strains that have some association with cervical cancer, but do not cause visual lesion such as warts.  HPV type 16 and 18 have the highest association with cervical cancer.  The vast majority of HPV infections will be uncomplicated at clear spontaneously in 12-18 months in non-immunocompromised patients.  Patient with compromised immune systems, those taking immunosuppressive drugs, or smoker have shown to have a lower clearance rate and higher persistence of HPV infection.    Currently there are no FDA approved treatments to promote HPV clearance.  Gardasil vaccination is available to prevent HPV infection, but this is only beneficial pre-exposure.  Abstaining from intercourse will not increase clearance  Lastly we stressed that if properly followed HPV should not lead to cervical cancer.   The goal of screening is to identify patient who develop precancerous lesions of the  cervix and treat these prior to progression to frank cervical cancer.  The incidence of cervical cancer is 7 cases per 100,000 women in the [place] a year.  This relatively low rate is in part due to universal screening as well as vaccination efforts.     - She is comfortable with the plan and had her questions answered.  - Return in about 1 year (around 03/22/2021) for annual.   Vena Austria, MD, [facility], [facility] 03/22/2020, 11:54 AM

## 2020-03-27 LAB — SURGICAL PATHOLOGY

## 2020-04-26 ENCOUNTER — Ambulatory Visit (INDEPENDENT_AMBULATORY_CARE_PROVIDER_SITE_OTHER): Payer: BC Managed Care – PPO

## 2020-04-26 ENCOUNTER — Other Ambulatory Visit: Payer: Self-pay

## 2020-04-26 DIAGNOSIS — Z3042 Encounter for surveillance of injectable contraceptive: Secondary | ICD-10-CM

## 2020-04-26 MED ORDER — MEDROXYPROGESTERONE ACETATE 150 MG/ML IM SUSP
150.0000 mg | Freq: Once | INTRAMUSCULAR | Status: AC
  Administered 2020-04-26: 150 mg via INTRAMUSCULAR

## 2020-04-26 NOTE — Progress Notes (Signed)
Pt here for depo which was given IM left deltoid.  NDC# [id number]  Pt states this is probably her last inj as they want to try to get preg for the "second and last time".

## 2020-06-15 DIAGNOSIS — Z20822 Contact with and (suspected) exposure to covid-19: Secondary | ICD-10-CM | POA: Diagnosis not present

## 2020-06-23 DIAGNOSIS — Z20822 Contact with and (suspected) exposure to covid-19: Secondary | ICD-10-CM | POA: Diagnosis not present

## 2020-06-30 ENCOUNTER — Other Ambulatory Visit: Payer: Self-pay | Admitting: Obstetrics and Gynecology

## 2020-06-30 DIAGNOSIS — Z3042 Encounter for surveillance of injectable contraceptive: Secondary | ICD-10-CM

## 2020-07-16 ENCOUNTER — Ambulatory Visit (INDEPENDENT_AMBULATORY_CARE_PROVIDER_SITE_OTHER): Payer: BC Managed Care – PPO

## 2020-07-16 ENCOUNTER — Other Ambulatory Visit: Payer: Self-pay

## 2020-07-16 DIAGNOSIS — Z3042 Encounter for surveillance of injectable contraceptive: Secondary | ICD-10-CM | POA: Diagnosis not present

## 2020-07-16 MED ORDER — MEDROXYPROGESTERONE ACETATE 150 MG/ML IM SUSP
150.0000 mg | Freq: Once | INTRAMUSCULAR | Status: AC
  Administered 2020-07-16: 150 mg via INTRAMUSCULAR

## 2020-07-16 NOTE — Progress Notes (Signed)
Patient presents today for Depo Provera injection within dates. Given IM LD. Patient tolerated well.  

## 2021-03-06 ENCOUNTER — Ambulatory Visit (INDEPENDENT_AMBULATORY_CARE_PROVIDER_SITE_OTHER): Payer: BC Managed Care – PPO | Admitting: Obstetrics and Gynecology

## 2021-03-06 ENCOUNTER — Other Ambulatory Visit (HOSPITAL_COMMUNITY)
Admission: RE | Admit: 2021-03-06 | Discharge: 2021-03-06 | Disposition: A | Discharge status: Home / Self Care | Payer: BC Managed Care – PPO | Source: Ambulatory Visit | Attending: Obstetrics and Gynecology | Admitting: Obstetrics and Gynecology

## 2021-03-06 ENCOUNTER — Other Ambulatory Visit: Payer: Self-pay

## 2021-03-06 ENCOUNTER — Encounter: Payer: Self-pay | Admitting: Obstetrics and Gynecology

## 2021-03-06 VITALS — BP 90/60 | Ht 62.0 in | Wt 133.0 lb

## 2021-03-06 DIAGNOSIS — R87612 Low grade squamous intraepithelial lesion on cytologic smear of cervix (LGSIL): Secondary | ICD-10-CM | POA: Insufficient documentation

## 2021-03-06 DIAGNOSIS — Z01419 Encounter for gynecological examination (general) (routine) without abnormal findings: Secondary | ICD-10-CM | POA: Diagnosis not present

## 2021-03-06 DIAGNOSIS — Z124 Encounter for screening for malignant neoplasm of cervix: Secondary | ICD-10-CM

## 2021-03-06 DIAGNOSIS — Z1151 Encounter for screening for human papillomavirus (HPV): Secondary | ICD-10-CM | POA: Diagnosis not present

## 2021-03-06 DIAGNOSIS — Z3169 Encounter for other general counseling and advice on procreation: Secondary | ICD-10-CM

## 2021-03-06 NOTE — Patient Instructions (Signed)
I value your feedback and you entrusting us with your care. If you get a [facility] patient survey, I would appreciate you taking the time to let us know about your experience today. Thank you! ? ? ?

## 2021-03-06 NOTE — Progress Notes (Signed)
PCP:  Patient, No Pcp Per (Inactive)   Chief Complaint  Patient presents with   Gynecologic Exam    No concerns     HPI:      [name] is a 30 y.o. G1P1001 whose LMP was Patient's last menstrual period was 02/19/2021 (exact date)., presents today for her annual examination.  Her menses are monthly off depo, lasting 5-6 days.  Dysmenorrhea mild. She does not have intermenstrual bleeding. Last depo 07/16/20.   Sex activity: single partner, contraception - none, conception ok. Hasn't started PNVs.  Last Pap: 02/23/20 Results were: LGSIL; had colpo 9/21 with CIN 1 with Dr. Bonney Miller, repeat pap due today  There is no FH of breast cancer. There is no FH of ovarian cancer. The patient does not do self-breast exams.  Tobacco use: The patient denies current or previous tobacco use. Alcohol use: none No drug use.  Exercise: moderately active  She does get adequate calcium but not Vitamin D in her diet.   Past Medical History:  Diagnosis Date   Medical history non-contributory     Past Surgical History:  Procedure Laterality Date   NO PAST SURGERIES      Family History  Problem Relation Age of Onset   Diabetes Mother    Heart attack Mother     Social History   Socioeconomic History   Marital status: Single    Spouse name: Not on file   Number of children: Not on file   Years of education: Not on file   Highest education level: Not on file  Occupational History   Not on file  Tobacco Use   Smoking status: Never   Smokeless tobacco: Never  Vaping Use   Vaping Use: Never used  Substance and Sexual Activity   Alcohol use: Yes    Comment: social   Drug use: No   Sexual activity: Yes    Birth control/protection: None  Other Topics Concern   Not on file  Social History Narrative   Not on file   Social Determinants of Health   Financial Resource Strain: Not on file  Food Insecurity: Not on file  Transportation Needs: Not on file  Physical Activity: Not  on file  Stress: Not on file  Social Connections: Not on file  Intimate Partner Violence: Not on file    No current outpatient medications on file.     ROS:  Review of Systems  Constitutional:  Negative for fatigue, fever and unexpected weight change.  Respiratory:  Negative for cough, shortness of breath and wheezing.   Cardiovascular:  Negative for chest pain, palpitations and leg swelling.  Gastrointestinal:  Negative for blood in stool, constipation, diarrhea, nausea and vomiting.  Endocrine: Negative for cold intolerance, heat intolerance and polyuria.  Genitourinary:  Negative for dyspareunia, dysuria, flank pain, frequency, genital sores, hematuria, menstrual problem, pelvic pain, urgency, vaginal bleeding, vaginal discharge and vaginal pain.  Musculoskeletal:  Positive for arthralgias. Negative for back pain, joint swelling and myalgias.  Skin:  Negative for rash.  Neurological:  Negative for dizziness, syncope, light-headedness, numbness and headaches.  Hematological:  Negative for adenopathy.  Psychiatric/Behavioral:  Negative for agitation, confusion, sleep disturbance and suicidal ideas. The patient is not nervous/anxious.  BREAST: No symptoms   Objective: BP 90/60   Ht 5\' 2"  (1.575 m)   Wt 133 lb (60.3 kg)   LMP 02/19/2021 (Exact Date)   BMI 24.33 kg/m    Physical Exam Constitutional:  Appearance: She is well-developed.  Genitourinary:     Vulva normal.     Right Labia: No rash, tenderness or lesions.    Left Labia: No tenderness, lesions or rash.    No vaginal discharge, erythema or tenderness.      Right Adnexa: not tender and no mass present.    Left Adnexa: not tender and no mass present.    No cervical friability or polyp.     Uterus is not enlarged or tender.  Breasts:    Right: No mass, nipple discharge, skin change or tenderness.     Left: No mass, nipple discharge, skin change or tenderness.  Neck:     Thyroid: No thyromegaly.   Cardiovascular:     Rate and Rhythm: Normal rate and regular rhythm.     Heart sounds: Normal heart sounds. No murmur heard. Pulmonary:     Effort: Pulmonary effort is normal.     Breath sounds: Normal breath sounds.  Abdominal:     Palpations: Abdomen is soft.     Tenderness: There is no abdominal tenderness. There is no guarding or rebound.  Musculoskeletal:        General: Normal range of motion.     Cervical back: Normal range of motion.  Lymphadenopathy:     Cervical: No cervical adenopathy.  Neurological:     General: No focal deficit present.     Mental Status: She is alert and oriented to person, place, and time.     Cranial Nerves: No cranial nerve deficit.  Skin:    General: Skin is warm and dry.  Psychiatric:        Mood and Affect: Mood normal.        Behavior: Behavior normal.        Thought Content: Thought content normal.        Judgment: Judgment normal.  Vitals reviewed.    Assessment/Plan: Encounter for annual routine gynecological examination  Cervical cancer screening - Plan: Cytology - PAP  Screening for HPV (human papillomavirus) - Plan: Cytology - PAP  LGSIL on Pap smear of cervix - Plan: Cytology - PAP; repeat pap today. Will call with results.   Pre-conception counseling--start PNVs. F/u prn.             GYN counsel adequate intake of calcium and vitamin D, diet and exercise     F/U  Return in about 1 year (around 03/06/2022).  Michelle Miller B. Michelle Ganser, PA-C 03/06/2021 2:27 PM

## 2021-03-10 LAB — CYTOLOGY - PAP
Comment: NEGATIVE
Diagnosis: UNDETERMINED — AB
High risk HPV: POSITIVE — AB

## 2021-03-11 ENCOUNTER — Encounter: Payer: Self-pay | Admitting: Obstetrics and Gynecology

## 2021-04-11 ENCOUNTER — Ambulatory Visit (INDEPENDENT_AMBULATORY_CARE_PROVIDER_SITE_OTHER): Payer: BC Managed Care – PPO | Admitting: Advanced Practice Midwife

## 2021-04-11 ENCOUNTER — Other Ambulatory Visit: Payer: Self-pay

## 2021-04-11 ENCOUNTER — Encounter: Payer: Self-pay | Admitting: Advanced Practice Midwife

## 2021-04-11 ENCOUNTER — Other Ambulatory Visit (HOSPITAL_COMMUNITY)
Admission: RE | Admit: 2021-04-11 | Discharge: 2021-04-11 | Disposition: A | Discharge status: Home / Self Care | Payer: BC Managed Care – PPO | Source: Ambulatory Visit | Attending: Advanced Practice Midwife | Admitting: Advanced Practice Midwife

## 2021-04-11 VITALS — BP 100/60 | HR 71 | Wt 134.0 lb

## 2021-04-11 DIAGNOSIS — Z113 Encounter for screening for infections with a predominantly sexual mode of transmission: Secondary | ICD-10-CM

## 2021-04-11 DIAGNOSIS — Z3490 Encounter for supervision of normal pregnancy, unspecified, unspecified trimester: Secondary | ICD-10-CM | POA: Insufficient documentation

## 2021-04-11 DIAGNOSIS — Z3481 Encounter for supervision of other normal pregnancy, first trimester: Secondary | ICD-10-CM | POA: Insufficient documentation

## 2021-04-11 DIAGNOSIS — Z3401 Encounter for supervision of normal first pregnancy, first trimester: Secondary | ICD-10-CM

## 2021-04-11 DIAGNOSIS — Z1159 Encounter for screening for other viral diseases: Secondary | ICD-10-CM

## 2021-04-11 DIAGNOSIS — Z130 Encounter for screening for diseases of the blood and blood-forming organs and certain disorders involving the immune mechanism: Secondary | ICD-10-CM

## 2021-04-11 DIAGNOSIS — Z369 Encounter for antenatal screening, unspecified: Secondary | ICD-10-CM | POA: Diagnosis not present

## 2021-04-11 DIAGNOSIS — N912 Amenorrhea, unspecified: Secondary | ICD-10-CM | POA: Diagnosis not present

## 2021-04-11 LAB — POCT URINALYSIS DIPSTICK OB
Appearance: NORMAL
Bilirubin, UA: NEGATIVE
Blood, UA: NEGATIVE
Glucose, UA: NEGATIVE
Ketones, UA: NEGATIVE
Leukocytes, UA: NEGATIVE
Nitrite, UA: NEGATIVE
Odor: NORMAL
POC,PROTEIN,UA: NEGATIVE
Spec Grav, UA: 1.015 (ref 1.010–1.025)
Urobilinogen, UA: 0.2 E.U./dL
pH, UA: 6 (ref 5.0–8.0)

## 2021-04-11 LAB — POCT URINE PREGNANCY: Preg Test, Ur: POSITIVE — AB

## 2021-04-11 NOTE — Patient Instructions (Signed)

## 2021-04-15 LAB — RPR+RH+ABO+RUB AB+AB SCR+CB...
Antibody Screen: NEGATIVE
HIV Screen 4th Generation wRfx: NONREACTIVE
Hematocrit: 36.8 % (ref 34.0–46.6)
Hemoglobin: 12.1 g/dL (ref 11.1–15.9)
Hepatitis B Surface Ag: NEGATIVE
MCH: 28.1 pg (ref 26.6–33.0)
MCHC: 32.9 g/dL (ref 31.5–35.7)
MCV: 86 fL (ref 79–97)
Platelets: 309 10*3/uL (ref 150–450)
RBC: 4.3 x10E6/uL (ref 3.77–5.28)
RDW: 11.8 % (ref 11.7–15.4)
RPR Ser Ql: NONREACTIVE
Rh Factor: POSITIVE
Rubella Antibodies, IGG: 8.46 index (ref >0.99)
Varicella zoster IgG: 1290 index (ref >165)
WBC: 10.1 10*3/uL (ref 3.4–10.8)

## 2021-04-15 LAB — CERVICOVAGINAL ANCILLARY ONLY
Chlamydia: NEGATIVE
Comment: NEGATIVE
Comment: NEGATIVE
Comment: NORMAL
Neisseria Gonorrhea: NEGATIVE
Trichomonas: NEGATIVE

## 2021-04-15 LAB — HEPATITIS C ANTIBODY: Hep C Virus Ab: <0.1 s/co ratio (ref 0.0–0.9)

## 2021-04-15 LAB — HGB FRACTIONATION CASCADE
Hgb A2: 2.5 % (ref 1.8–3.2)
Hgb A: 97.5 % (ref 96.4–98.8)
Hgb F: 0 % (ref 0.0–2.0)
Hgb S: 0 %

## 2021-04-16 LAB — URINE CULTURE

## 2021-04-16 NOTE — Progress Notes (Addendum)
New Obstetric Patient H&P  Date of Service: 04/11/2021  Chief Complaint: "Desires prenatal care"   History of Present Illness: Patient is a 30 y.o. G2P1001 Not Hispanic or Latino female, presents with amenorrhea and positive home pregnancy test. Patient's last menstrual period was 02/19/2021 (exact date). and based on her  LMP, her EDD is Estimated Date of Delivery: 11/26/21 and her EGA is [redacted]w[redacted]d. Cycles are 5-6 days, regular, and occur approximately every : 28 days. Her last pap smear was 1 month ago and was ASCUS with POSITIVE high risk HPV.    She had a urine pregnancy test which was positive 2 or 3 week(s)  ago. Her last menstrual period was normal and lasted for  5 or 6 day(s). Since her LMP she claims she has experienced breast tenderness, fatigue. She denies vaginal bleeding. Her past medical history is noncontributory. Her prior pregnancies are notable for  2019 FT SVD 6#12oz female  Since her LMP, she admits to the use of tobacco products  no She claims she has gained  3 or 4  pounds since the start of her pregnancy.  There are cats in the home in the home  no I She admits close contact with children on a regular basis  yes  She has had chicken pox in the past no She has had Tuberculosis exposures, symptoms, or previously tested positive for TB   no Current or past history of domestic violence. no  Genetic Screening/Teratology Counseling: (Includes patient, baby's father, or anyone in either family with:)   1. Patient's age >/= 41 [facility]  no 2. Thalassemia ([place], [place], Mediterranean, or Asian background): MCV<80  no 3. Neural tube defect (meningomyelocele, spina bifida, anencephaly)  no 4. Congenital heart defect  no  5. Down syndrome  no 6. Tay-Sachs (Jewish, [place]  no 7. Canavan's Disease  no 8. Sickle cell disease or trait (African)  no  9. Hemophilia or other blood disorders  no  10. Muscular dystrophy  no  11. Cystic fibrosis  no  12. Huntington's Chorea  no   13. Mental retardation/autism  no 14. Other inherited genetic or chromosomal disorder  no 15. Maternal metabolic disorder (DM, PKU, etc)  no 16. Patient or FOB with a child with a birth defect not listed above no  16a. Patient or FOB with a birth defect themselves no 17. Recurrent pregnancy loss, or stillbirth  no  18. Any medications since LMP other than prenatal vitamins (include vitamins, supplements, OTC meds, drugs, alcohol)  no 19. Any other genetic/environmental exposure to discuss  no  Infection History:   1. Lives with someone with TB or TB exposed  no  2. Patient or partner has history of genital herpes  no 3. Rash or viral illness since LMP  no 4. History of STI (GC, CT, HPV, syphilis, HIV)  no 5. History of recent travel :  no  Other pertinent information:  no     Review of Systems:10 point review of systems negative unless otherwise noted in HPI  Past Medical History:  Patient Active Problem List   Diagnosis Date Noted   Supervision of normal pregnancy 04/11/2021     Nursing Staff Provider  Office Location  [facility]    Language  English Anatomy US    Flu Vaccine   Genetic Screen  NIPS:   TDaP vaccine    Hgb A1C or  GTT Early : Third trimester :   Covid    LAB RESULTS  Rhogam   Blood Type AB/Positive/-- (11/03 1608)   Feeding Plan Breast Antibody Negative (11/03 1608)  Contraception  Rubella 8.46 (11/03 1608)  Circumcision  RPR Non Reactive (11/03 1608)   Pediatrician   HBsAg Negative (11/03 1608)   Support Person Jalen HIV Non Reactive (11/03 1608)  Prenatal Classes  Varicella     GBS  (For PCN allergy, check sensitivities)   BTL Consent     VBAC Consent NA Pap  2022 ASCUS/+HPV    Hgb Electro    Pelvis Tested 6#12oz CF      SMA            LGSIL on Pap smear of cervix 03/06/2021    Past Surgical History:  Past Surgical History:  Procedure Laterality Date   NO PAST SURGERIES      Gynecologic History: Patient's last menstrual period was  02/19/2021 (exact date).  Obstetric History: G2P1001  Family History:  Family History  Problem Relation Age of Onset   Diabetes Mother    Heart attack Mother     Social History:  Social History   Socioeconomic History   Marital status: Single    Spouse name: Not on file   Number of children: Not on file   Years of education: Not on file   Highest education level: Not on file  Occupational History   Not on file  Tobacco Use   Smoking status: Never   Smokeless tobacco: Never  Vaping Use   Vaping Use: Never used  Substance and Sexual Activity   Alcohol use: Not Currently    Comment: social   Drug use: No   Sexual activity: Yes    Birth control/protection: None  Other Topics Concern   Not on file  Social History Narrative   Not on file   Social Determinants of Health   Financial Resource Strain: Not on file  Food Insecurity: Not on file  Transportation Needs: Not on file  Physical Activity: Not on file  Stress: Not on file  Social Connections: Not on file  Intimate Partner Violence: Not on file    Allergies:  No Known Allergies  Medications: Prior to Admission medications   Not on File    Physical Exam Vitals: Blood pressure 100/60, pulse 71, weight 134 lb (60.8 kg), last menstrual period 02/19/2021.  General: NAD HEENT: normocephalic, anicteric Thyroid: no enlargement, no palpable nodules Pulmonary: No increased work of breathing, CTAB Cardiovascular: RRR, distal pulses 2+ Abdomen: NABS, soft, non-tender, non-distended.  Umbilicus without lesions.  No hepatomegaly, splenomegaly or masses palpable. No evidence of hernia  Genitourinary:  External: Normal external female genitalia.  Normal urethral meatus, normal  Bartholin's and Skene's glands.    Vagina: Normal vaginal mucosa, no evidence of prolapse.   Extremities: no edema, erythema, or tenderness Neurologic: Grossly intact Psychiatric: mood appropriate, affect full   The following were  addressed during this visit:  Breastfeeding Education - Early initiation of breastfeeding    Comments: Keeps milk supply adequate, helps contract uterus and slow bleeding, and early milk is the perfect first food and is easy to digest.   - The importance of exclusive breastfeeding    Comments: Provides antibodies, Lower risk of breast and ovarian cancers, and type-2 diabetes,Helps your body recover, Reduced chance of SIDS.   - Risks of giving your baby anything other than breast milk if you are breastfeeding    Comments: Make the baby less content with breastfeeds, may make my baby more susceptible to illness, and  may reduce my milk supply.   - The importance of early skin-to-skin contact    Comments:  Keeps baby warm and secure, helps keep baby's blood sugar up and breathing steady, easier to bond and breastfeed, and helps calm baby.  - Rooming-in on a 24-hour basis    Comments: Easier to learn baby's feeding cues, easier to bond and get to know each other, and encourages milk production.   - Feeding on demand or baby-led feeding    Comments: Helps prevent breastfeeding complications, helps bring in good milk supply, prevents under or overfeeding, and helps baby feel content and satisfied   - Frequent feeding to help assure optimal milk production    Comments: Making a full supply of milk requires frequent removal of milk from breasts, infant will eat 8-12 times in 24 hours, if separated from infant use breast massage, hand expression and/ or pumping to remove milk from breasts.   - Effective positioning and attachment    Comments: Helps my baby to get enough breast milk, helps to produce an adequate milk supply, and helps prevent nipple pain and damage   - Exclusive breastfeeding for the first 6 months    Comments: Builds a healthy milk supply and keeps it up, protects baby from sickness and disease, and breastmilk has everything your baby needs for the first 6  months.   Assessment: 30 y.o. G2P1001 at [redacted]w[redacted]d by LMP presenting to initiate prenatal care  Plan: 1) Avoid alcoholic beverages. 2) Patient encouraged not to smoke.  3) Discontinue the use of all non-medicinal drugs and chemicals.  4) Take prenatal vitamins daily.  5) Nutrition, food safety (fish, cheese advisories, and high nitrite foods) and exercise discussed. 6) Hospital and practice style discussed with cross coverage system.  7) Genetic Screening, such as with 1st Trimester Screening, cell free fetal DNA, AFP testing, and Ultrasound, as well as with amniocentesis and CVS as appropriate, is discussed with patient. At the conclusion of today's visit patient undecided genetic testing 8) Patient is asked about travel to areas at risk for the [place] virus, and counseled to avoid travel and exposure to mosquitoes or sexual partners who may have themselves been exposed to the virus. Testing is discussed, and will be ordered as appropriate.  9) Urine culture, aptima, NOB panel, hep c screen, sickle cell screen done today 10) Return to clinic in 1 week for dating scan and ROB   Tresea Mall, CNM [facility] 04/16/2021, 8:19 AM

## 2021-04-17 ENCOUNTER — Encounter: Payer: Self-pay | Admitting: Obstetrics & Gynecology

## 2021-04-17 ENCOUNTER — Ambulatory Visit (INDEPENDENT_AMBULATORY_CARE_PROVIDER_SITE_OTHER): Payer: BC Managed Care – PPO | Admitting: Obstetrics & Gynecology

## 2021-04-17 ENCOUNTER — Other Ambulatory Visit: Payer: Self-pay

## 2021-04-17 VITALS — BP 100/60 | Wt 134.0 lb

## 2021-04-17 DIAGNOSIS — Z369 Encounter for antenatal screening, unspecified: Secondary | ICD-10-CM

## 2021-04-17 DIAGNOSIS — N926 Irregular menstruation, unspecified: Secondary | ICD-10-CM | POA: Diagnosis not present

## 2021-04-17 DIAGNOSIS — Z3A08 8 weeks gestation of pregnancy: Secondary | ICD-10-CM

## 2021-04-17 DIAGNOSIS — Z3481 Encounter for supervision of other normal pregnancy, first trimester: Secondary | ICD-10-CM

## 2021-04-17 NOTE — Patient Instructions (Signed)

## 2021-04-17 NOTE — Progress Notes (Signed)
ULTRASOUND REPORT  Location: [facility] Date of Service: 04/17/2021   Indications: Missed period. Prior Depo use. Findings:  Gestational sac and yolc sac are visualized without a CRL.  No clear gestational age established  FHR: not seen CRL measurement: none Yolk sac is visualized and appears normal.  3.3 mm. Amnion: visualized and appears normal   Right Ovary is normal in appearance. Left Ovary is normal appearance. Corpus luteal cyst:  is not visualized Survey of the adnexa demonstrates no adnexal masses. There is no free peritoneal fluid in the cul de sac.  Impression: 1. Missed abortion vs early gestation age on TVUS today..  Recommendations: 1.Clinical correlation with the patient's History and Physical Exam. 2. Repeat US in 5-6 days  Letitia Libra, MD

## 2021-04-17 NOTE — Progress Notes (Signed)
  Subjective  Mild nausea No pain or bleeding  Objective  BP 100/60   Wt 134 lb (60.8 kg)   LMP 02/19/2021 (Exact Date)   BMI 24.51 kg/m  General: NAD Pumonary: no increased work of breathing Abdomen: gravid, non-tender Extremities: no edema Psychiatric: mood appropriate, affect full  Assessment  30 y.o. G2P1001 at [redacted]w[redacted]d by  11/26/2021, by Last Menstrual Period presenting for routine prenatal visit  Plan   Problem List Items Addressed This Visit      Other   Supervision of normal pregnancy - Primary  Other Visit Diagnoses    [redacted] weeks gestation of pregnancy       Missed period        Repeat US Monday (No fetal pole or FHT today) PNV Decl NIPT  Annamarie Major, MD, [facility], [facility] 04/17/2021  4:03 PM

## 2021-04-22 ENCOUNTER — Other Ambulatory Visit: Payer: Self-pay

## 2021-04-22 ENCOUNTER — Other Ambulatory Visit: Payer: Self-pay | Admitting: Obstetrics & Gynecology

## 2021-04-22 ENCOUNTER — Telehealth: Payer: Self-pay

## 2021-04-22 ENCOUNTER — Ambulatory Visit
Admission: RE | Admit: 2021-04-22 | Discharge: 2021-04-22 | Disposition: A | Discharge status: Home / Self Care | Payer: BC Managed Care – PPO | Source: Ambulatory Visit | Attending: Obstetrics & Gynecology | Admitting: Obstetrics & Gynecology

## 2021-04-22 DIAGNOSIS — O208 Other hemorrhage in early pregnancy: Secondary | ICD-10-CM | POA: Diagnosis not present

## 2021-04-22 DIAGNOSIS — N926 Irregular menstruation, unspecified: Secondary | ICD-10-CM | POA: Diagnosis not present

## 2021-04-22 DIAGNOSIS — Z3A01 Less than 8 weeks gestation of pregnancy: Secondary | ICD-10-CM | POA: Diagnosis not present

## 2021-04-22 NOTE — Telephone Encounter (Signed)
Tried to call, no answer.  Try again in am (Tues). Let her know we need to see in follow up to discuss plans for treating this pregnancy (probable miscarriage) as well as birth control and /or future pregnancy planning.  That can be today at appt, or to schedule another day within the next 2-3 weeks.

## 2021-04-22 NOTE — Telephone Encounter (Signed)
Pt calling; was seen Fri at [facility]; baby wan't measuring what it was supposed to; was tole either the date are off or miscarrying; is pretty sure she started miscarrying over the weekend; has been passing things; Keep appt today?  [phone]  Adv pt to keep appt today so we can follow her.

## 2021-04-22 NOTE — Telephone Encounter (Signed)
Pt calling; kept u/s appt today; calling for results; is it still necessary for pt to keep appt here tomorrow?  [phone]

## 2021-04-23 ENCOUNTER — Encounter: Payer: Self-pay | Admitting: Obstetrics and Gynecology

## 2021-04-23 ENCOUNTER — Ambulatory Visit (INDEPENDENT_AMBULATORY_CARE_PROVIDER_SITE_OTHER): Payer: BC Managed Care – PPO | Admitting: Obstetrics and Gynecology

## 2021-04-23 ENCOUNTER — Other Ambulatory Visit
Admission: RE | Admit: 2021-04-23 | Discharge: 2021-04-23 | Disposition: A | Discharge status: Home / Self Care | Payer: BC Managed Care – PPO | Attending: Obstetrics and Gynecology | Admitting: Obstetrics and Gynecology

## 2021-04-23 VITALS — Wt 132.0 lb

## 2021-04-23 DIAGNOSIS — O200 Threatened abortion: Secondary | ICD-10-CM | POA: Insufficient documentation

## 2021-04-23 DIAGNOSIS — Z3A00 Weeks of gestation of pregnancy not specified: Secondary | ICD-10-CM | POA: Diagnosis not present

## 2021-04-23 LAB — HCG, QUANTITATIVE, PREGNANCY: hCG, Beta Chain, Quant, S: 9577 m[IU]/mL — ABNORMAL HIGH (ref <5)

## 2021-04-23 NOTE — Progress Notes (Addendum)
Obstetrics & Gynecology Office Visit    Chief Complaint  Patient presents with   Follow up bleeding in 1st trimester     History of Present Illness: 30 y.o. G96P1001 female who presents in follow up for vaginal bleeding in first trimester.  Pt started having bleeding Saturday into Sunday. Monday started having clots. Today her bleeding is much lighter. Yesterday she had a pelvic ultrasound that showed a gestational sac with nothing else inside it.  There was also a small subchorionic hemorrhage. The sac measured 1.3 cm.  I do not have measurements from 11/9.     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  Obstetric History: G2P1001  Family History  Problem Relation Age of Onset   Diabetes Mother    Heart attack Mother     Social History   Socioeconomic History   Marital status: Single    Spouse name: Not on file   Number of children: Not on file   Years of education: Not on file   Highest education level: Not on file  Occupational History   Not on file  Tobacco Use   Smoking status: Never   Smokeless tobacco: Never  Vaping Use   Vaping Use: Never used  Substance and Sexual Activity   Alcohol use: Not Currently    Comment: social   Drug use: No   Sexual activity: Yes    Birth control/protection: None  Other Topics Concern   Not on file  Social History Narrative   Not on file   Social Determinants of Health   Financial Resource Strain: Not on file  Food Insecurity: Not on file  Transportation Needs: Not on file  Physical Activity: Not on file  Stress: Not on file  Social Connections: Not on file  Intimate Partner Violence: Not on file    No Known Allergies  Prior to Admission medications   Denies    Review of Systems  Constitutional: Negative.    HENT: Negative.    Eyes: Negative.   Respiratory: Negative.    Cardiovascular: Negative.   Gastrointestinal: Negative.   Genitourinary: Negative.   Musculoskeletal: Negative.   Skin: Negative.   Neurological: Negative.   Psychiatric/Behavioral: Negative.      Physical Exam Wt 132 lb (59.9 kg)   LMP 02/19/2021 (Exact Date)   BMI 24.14 kg/m  Patient's last menstrual period was 02/19/2021 (exact date). Physical Exam Constitutional:      General: She is not in acute distress.    Appearance: Normal appearance.  HENT:     Head: Normocephalic and atraumatic.  Eyes:     General: No scleral icterus.    Conjunctiva/sclera: Conjunctivae normal.  Neurological:     General: No focal deficit present.     Mental Status: She is alert and oriented to person, place, and time.     Cranial Nerves: No cranial nerve deficit.  Psychiatric:        Mood and Affect: Mood normal.        Behavior: Behavior normal.        Judgment: Judgment normal.    Female chaperone present for pelvic and breast  portions of the physical exam  Imaging Results US OB LESS THAN 14 WEEKS WITH OB TRANSVAGINAL  Addendum Date: 04/22/2021   ADDENDUM  REPORT: 04/22/2021 15:39 TECHNIQUE: Transabdominal and transvaginal ultrasound was performed for evaluation of the gestation as well as the maternal uterus, endometrium, ovaries and adnexal regions. Electronically Signed   By: Elige Ko M.D.   On: 04/22/2021 15:39   Result Date: 04/22/2021 CLINICAL DATA:  Missed., vaginal bleeding EXAM: OBSTETRIC <14 WK ULTRASOUND TECHNIQUE: Transabdominal ultrasound was performed for evaluation of the gestation as well as the maternal uterus and adnexal regions. COMPARISON:  None. FINDINGS: Intrauterine gestational sac: Single Yolk sac:  Not Visualized. Embryo:  Not Visualized. Cardiac Activity: Not Visualized. MSD: 13.1  mm   6 w   1  d Subchorionic hemorrhage:  Small subchorionic hemorrhage. Maternal uterus/adnexae: No adnexal mass.  Echogenic material in the lower uterine segment. Trace pelvic free fluid. IMPRESSION: 1. A 13.1 mm gestational sac without a yolk sac, embryo or cardiac activity. Findings are suspicious but not yet definitive for failed pregnancy. Recommend follow-up US in 10-14 days for definitive diagnosis. This recommendation follows SRU consensus guidelines: Diagnostic Criteria for Nonviable Pregnancy Early in the First Trimester. Malva Limes Med 2013; 081:4481-85. Electronically Signed: By: Elige Ko M.D. On: 04/22/2021 13:42     Assessment: 30 y.o. G61P1001 female here for  1. Threatened abortion      Plan: Problem List Items Addressed This Visit   None Visit Diagnoses     Threatened abortion    -  Primary   Relevant Orders   B-HCG Quant   B-HCG Quant      Discussed that we could follow u/s guidelines and follow up in 14+ days or, in the mean time, follow hCG levels to see if they are increase appropriately. We discussed diagnosis of miscarriage. This does have the ring of a miscarriage. However, I can't tell her with 100% certainty at this time. Will await the above to give her definitive diagnosis.  A total of 23 minutes were spent face-to-face with the patient as well as preparation, review, communication, and documentation during this encounter.    [name], MD 04/23/2021 12:59 PM

## 2021-04-23 NOTE — Telephone Encounter (Signed)
Pt aware; states she will be here for appt later this am.

## 2021-04-25 ENCOUNTER — Other Ambulatory Visit
Admission: RE | Admit: 2021-04-25 | Discharge: 2021-04-25 | Disposition: A | Discharge status: Home / Self Care | Payer: BC Managed Care – PPO | Attending: Obstetrics and Gynecology | Admitting: Obstetrics and Gynecology

## 2021-04-25 ENCOUNTER — Telehealth: Payer: Self-pay | Admitting: Obstetrics and Gynecology

## 2021-04-25 DIAGNOSIS — O200 Threatened abortion: Secondary | ICD-10-CM | POA: Insufficient documentation

## 2021-04-25 DIAGNOSIS — Z3A00 Weeks of gestation of pregnancy not specified: Secondary | ICD-10-CM | POA: Insufficient documentation

## 2021-04-25 LAB — HCG, QUANTITATIVE, PREGNANCY: hCG, Beta Chain, Quant, S: 8002 m[IU]/mL — ABNORMAL HIGH (ref <5)

## 2021-04-25 NOTE — Telephone Encounter (Signed)
I left her a generic VM. I asked her to return my call. She could talk with any provider. Of note, she has had two non-specific dating ultrasounds (gestational sac with no IUP).  Her hCG has dropped indicating a miscarriage.  Discussing management of this is the point of the call. She may have already started the process of miscarriage with some bleeding (see my last note).

## 2021-04-25 NOTE — Telephone Encounter (Signed)
Pt returning SDJ's call; wants to confirm information.  [phone]

## 2021-04-29 ENCOUNTER — Telehealth: Payer: Self-pay | Admitting: Obstetrics and Gynecology

## 2021-04-29 NOTE — Telephone Encounter (Signed)
Left generic VM 

## 2021-04-30 ENCOUNTER — Telehealth: Payer: Self-pay | Admitting: Obstetrics and Gynecology

## 2021-04-30 NOTE — Telephone Encounter (Signed)
Discussed drop in hCG. Discussed that this means her pregnancy is not moving forward. She has continued to bleed like a period for her since her last visit. Recommend checking a home pregnancy test ing 2-3 weeks and if still positive have hCG drawn again to make sure the level is at least dropping appropriately. She voiced understanding and agreement.

## 2021-06-09 NOTE — L&D Delivery Note (Signed)
Vaginal Delivery Note ? ?Spontaneous delivery of live nonviable infant through an intact perineum. Baby delivered in the amniotic sac. Amniotic sac ruptures. Cord clamped. Infant placed on blanket and shown to mom. Infant delivered with a heart beat, but passed away shortly after delivery.  Cord blood not collected.  Patient was given IM pitocin and oral Cytotec. Awaiting delivery of placenta.  No lacerations.  ? ?Adelene Idler MD ?[facility], [facility] ?09/16/21 ?12:56 AM ? ? ?

## 2021-07-29 ENCOUNTER — Other Ambulatory Visit (HOSPITAL_COMMUNITY)
Admission: RE | Admit: 2021-07-29 | Discharge: 2021-07-29 | Disposition: A | Discharge status: Home / Self Care | Payer: BC Managed Care – PPO | Source: Ambulatory Visit | Attending: Licensed Practical Nurse | Admitting: Licensed Practical Nurse

## 2021-07-29 ENCOUNTER — Other Ambulatory Visit: Payer: Self-pay

## 2021-07-29 ENCOUNTER — Encounter: Payer: Self-pay | Admitting: Licensed Practical Nurse

## 2021-07-29 ENCOUNTER — Ambulatory Visit (INDEPENDENT_AMBULATORY_CARE_PROVIDER_SITE_OTHER): Payer: BC Managed Care – PPO | Admitting: Licensed Practical Nurse

## 2021-07-29 VITALS — BP 120/60 | Wt 125.0 lb

## 2021-07-29 DIAGNOSIS — Z3480 Encounter for supervision of other normal pregnancy, unspecified trimester: Secondary | ICD-10-CM | POA: Insufficient documentation

## 2021-07-29 DIAGNOSIS — Z3A09 9 weeks gestation of pregnancy: Secondary | ICD-10-CM | POA: Diagnosis not present

## 2021-07-29 DIAGNOSIS — Z113 Encounter for screening for infections with a predominantly sexual mode of transmission: Secondary | ICD-10-CM

## 2021-07-29 DIAGNOSIS — Z3481 Encounter for supervision of other normal pregnancy, first trimester: Secondary | ICD-10-CM | POA: Diagnosis not present

## 2021-07-29 NOTE — Progress Notes (Signed)
New Obstetric Patient H&P    Chief Complaint: "Desires prenatal care"   History of Present Illness: Patient is a 31 y.o. G5P1001 Not Hispanic or Latino female, presents with amenorrhea and positive home pregnancy test. Patient's last menstrual period was 05/21/2021 (exact date). and based on her  LMP, her EDD is Estimated Date of Delivery: 02/25/22 and her EGA is [redacted]w[redacted]d. Cycles are 6. days, regular, and occur approximately every :  monthly  Her last pap smear was  03/06/2021  years ago and was  ASC-US HRHPV pos .  Did have a colpo in 2021 was told "everything was fine"  Had a SAB in Nov 2022.  So is very anxious/nervous does not want to do anything to cause a miscarriage-also verbalized she understands she cannot cause one-but is still worried.    She had a urine pregnancy test which was positive on January 27. Her last menstrual period was normal and lasted for  6 day(s). Since her LMP she claims she has experienced fatigue and nausea. She denies vaginal bleeding. Her past medical history is noncontributory. Her prior pregnancies are notable for none SVB 11/17/2017, female at 39 weeks, no complications, breastfed x 9 months then supply dried up. Gained 40lbs that pregnancy.  Since her LMP, she admits to the use of tobacco products  no She claims she has gained   yes, started at 120  pounds since the start of her pregnancy.  There are cats in the home in the home  no  She admits close contact with children on a regular basis  yes  She has had chicken pox in the past yes She has had Tuberculosis exposures, symptoms, or previously tested positive for TB   no Current or past history of domestic violence. no  Genetic Screening/Teratology Counseling: (Includes patient, baby's father, or anyone in either family with:)  Both ,she and [place], are African American   1. Patient's age >/= 69 at [facility]  no 2. Thalassemia ([place], [place], Mediterranean, or Asian background): MCV<80  no 3. Neural tube defect  (meningomyelocele, spina bifida, anencephaly)  no 4. Congenital heart defect  no  5. Down syndrome  no 6. Tay-Sachs (Jewish, [place]  no 7. Canavan's Disease  no 8. Sickle cell disease or trait (African)  no  9. Hemophilia or other blood disorders  no  10. Muscular dystrophy  no  11. Cystic fibrosis  no  12. Huntington's Chorea  no  13. Mental retardation/autism  no 14. Other inherited genetic or chromosomal disorder  no 15. Maternal metabolic disorder (DM, PKU, etc)  no 16. Patient or FOB with a child with a birth defect not listed above no  16a. Patient or FOB with a birth defect themselves no 17. Recurrent pregnancy loss, or stillbirth  no  18. Any medications since LMP other than prenatal vitamins (include vitamins, supplements, OTC meds, drugs, alcohol)  no 19. Any other genetic/environmental exposure to discuss  no  Infection History:   1. Lives with someone with TB or TB exposed  no  2. Patient or partner has history of genital herpes  no 3. Rash or viral illness since LMP  no 4. History of STI (GC, CT, HPV, syphilis, HIV)  no 5. History of recent travel :  no  Other pertinent information:   Works from home for [employer] with partner and sone, feels safe  Does not eat pork Likes to go to the gym but is worried so has not exercised much  lately.   Review of Systems:10 point review of systems negative unless otherwise noted in HPI  Past Medical History:  Patient Active Problem List   Diagnosis Date Noted   Supervision of normal pregnancy 04/11/2021     Nursing Staff Provider  Office Location  [facility]    Language  English Anatomy US    Flu Vaccine   Genetic Screen  NIPS:   TDaP vaccine    Hgb A1C or  GTT Early : Third trimester :   Covid    LAB RESULTS   Rhogam   Blood Type AB/Positive/-- (11/03 1608)   Feeding Plan Breast Antibody Negative (11/03 1608)  Contraception  Rubella 8.46 (11/03 1608)  Circumcision  RPR Non Reactive (11/03 1608)    Pediatrician   HBsAg Negative (11/03 1608)   Support Person Jalen HIV Non Reactive (11/03 1608)  Prenatal Classes  Varicella     GBS  (For PCN allergy, check sensitivities)   BTL Consent     VBAC Consent NA Pap  2022 ASCUS/+HPV    Hgb Electro    Pelvis Tested 6#12oz CF      SMA             LGSIL on Pap smear of cervix 03/06/2021    Past Surgical History:  Past Surgical History:  Procedure Laterality Date   NO PAST SURGERIES      Gynecologic History: Patient's last menstrual period was 05/21/2021 (exact date).  Obstetric History: G5P1001  Family History:  Family History  Problem Relation Age of Onset   Diabetes Mother    Heart attack Mother     Social History:  Social History   Socioeconomic History   Marital status: Single    Spouse name: Not on file   Number of children: Not on file   Years of education: Not on file   Highest education level: Not on file  Occupational History   Not on file  Tobacco Use   Smoking status: Never   Smokeless tobacco: Never  Vaping Use   Vaping Use: Never used  Substance and Sexual Activity   Alcohol use: Not Currently    Comment: social   Drug use: No   Sexual activity: Yes    Birth control/protection: None  Other Topics Concern   Not on file  Social History Narrative   Not on file   Social Determinants of Health   Financial Resource Strain: Not on file  Food Insecurity: Not on file  Transportation Needs: Not on file  Physical Activity: Not on file  Stress: Not on file  Social Connections: Not on file  Intimate Partner Violence: Not on file    Allergies:  No Known Allergies  Medications: Prior to Admission medications   Medication Sig Start Date End Date Taking? Authorizing Provider  Prenatal Vit-Fe Fumarate-FA (PRENATAL 19) 29-1 MG CHEW Chew by mouth.   Yes [provider]    Physical Exam Vitals: Blood pressure 120/60, weight 125 lb (56.7 kg), last menstrual period 05/21/2021, unknown if  currently breastfeeding.  General: NAD HEENT: normocephalic, anicteric Thyroid: no enlargement, no palpable nodules Pulmonary: No increased work of breathing, CTAB Cardiovascular: RRR, distal pulses 2+ Abdomen: NABS, soft, non-tender, non-distended.  Umbilicus without lesions.  No hepatomegaly, splenomegaly or masses palpable. No evidence of hernia  Genitourinary:  External: Normal external female genitalia.  Normal urethral meatus, normal  Bartholin's and Skene's glands.    Vagina: Normal vaginal mucosa, no evidence of prolapse.    Cervix: Grossly  normal in appearance, no bleeding  Uterus: 9weeks size, mobile, normal contour.  No CMT  Adnexa: ovaries non-enlarged, no adnexal masses  Rectal: deferred Extremities: no edema, erythema, or tenderness Neurologic: Grossly intact Psychiatric: mood appropriate, affect full   Assessment: 31 y.o. G5P1001 at [redacted]w[redacted]d presenting to initiate prenatal care  Plan: 1) Avoid alcoholic beverages. 2) Patient encouraged not to smoke.  3) Discontinue the use of all non-medicinal drugs and chemicals.  4) Take prenatal vitamins daily.  5) Nutrition, food safety (fish, cheese advisories, and high nitrite foods) and exercise discussed. 6) Hospital and practice style discussed with cross coverage system.  7) Genetic Screening, such as with 1st Trimester Screening, cell free fetal DNA, AFP testing, and Ultrasound, as well as with amniocentesis and CVS as appropriate, is discussed with patient. At the conclusion of today's visit patient declined genetic testing 8) Patient is asked about travel to areas at risk for the [place] virus, and counseled to avoid travel and exposure to mosquitoes or sexual partners who may have themselves been exposed to the virus. Testing is discussed, and will be ordered as appropriate.  9) dating [place] ordered   Carie Caddy, [facility], [facility] 07/29/2021, 3:29 PM

## 2021-07-30 ENCOUNTER — Encounter: Payer: Self-pay | Admitting: Licensed Practical Nurse

## 2021-07-30 LAB — URINE DRUG PANEL 7
Amphetamines, Urine: NEGATIVE ng/mL
Barbiturate Quant, Ur: NEGATIVE ng/mL
Benzodiazepine Quant, Ur: NEGATIVE ng/mL
Cannabinoid Quant, Ur: NEGATIVE ng/mL
Cocaine (Metab.): NEGATIVE ng/mL
Opiate Quant, Ur: NEGATIVE ng/mL
PCP Quant, Ur: NEGATIVE ng/mL

## 2021-07-30 LAB — CBC/D/PLT+RPR+RH+ABO+RUBIGG...
Antibody Screen: NEGATIVE
Basophils Absolute: 0 10*3/uL (ref 0.0–0.2)
Basos: 0 %
EOS (ABSOLUTE): 0 10*3/uL (ref 0.0–0.4)
Eos: 0 %
HCV Ab: NONREACTIVE
HIV Screen 4th Generation wRfx: NONREACTIVE
Hematocrit: 31.9 % — ABNORMAL LOW (ref 34.0–46.6)
Hemoglobin: 10.7 g/dL — ABNORMAL LOW (ref 11.1–15.9)
Hepatitis B Surface Ag: NEGATIVE
Immature Grans (Abs): 0 10*3/uL (ref 0.0–0.1)
Immature Granulocytes: 0 %
Lymphocytes Absolute: 1.7 10*3/uL (ref 0.7–3.1)
Lymphs: 18 %
MCH: 28.6 pg (ref 26.6–33.0)
MCHC: 33.5 g/dL (ref 31.5–35.7)
MCV: 85 fL (ref 79–97)
Monocytes Absolute: 0.5 10*3/uL (ref 0.1–0.9)
Monocytes: 5 %
Neutrophils Absolute: 7.2 10*3/uL — ABNORMAL HIGH (ref 1.4–7.0)
Neutrophils: 77 %
Platelets: 299 10*3/uL (ref 150–450)
RBC: 3.74 x10E6/uL — ABNORMAL LOW (ref 3.77–5.28)
RDW: 12.3 % (ref 11.7–15.4)
RPR Ser Ql: NONREACTIVE
Rh Factor: POSITIVE
Rubella Antibodies, IGG: 7.1 index (ref >0.99)
Varicella zoster IgG: 1354 index (ref >165)
WBC: 9.6 10*3/uL (ref 3.4–10.8)

## 2021-07-30 LAB — HCV INTERPRETATION

## 2021-07-31 LAB — CERVICOVAGINAL ANCILLARY ONLY
Chlamydia: NEGATIVE
Comment: NEGATIVE
Comment: NORMAL
Neisseria Gonorrhea: NEGATIVE

## 2021-07-31 LAB — HGB FRACTIONATION CASCADE
Hgb A2: 2.4 % (ref 1.8–3.2)
Hgb A: 97.6 % (ref 96.4–98.8)
Hgb F: 0 % (ref 0.0–2.0)
Hgb S: 0 %

## 2021-07-31 LAB — URINE CULTURE

## 2021-08-27 ENCOUNTER — Ambulatory Visit (INDEPENDENT_AMBULATORY_CARE_PROVIDER_SITE_OTHER): Payer: BC Managed Care – PPO | Admitting: Obstetrics & Gynecology

## 2021-08-27 ENCOUNTER — Ambulatory Visit (INDEPENDENT_AMBULATORY_CARE_PROVIDER_SITE_OTHER): Payer: BC Managed Care – PPO | Admitting: Obstetrics

## 2021-08-27 ENCOUNTER — Other Ambulatory Visit: Payer: Self-pay

## 2021-08-27 VITALS — BP 122/70 | Wt 126.0 lb

## 2021-08-27 DIAGNOSIS — Z3A14 14 weeks gestation of pregnancy: Secondary | ICD-10-CM | POA: Diagnosis not present

## 2021-08-27 DIAGNOSIS — Z3480 Encounter for supervision of other normal pregnancy, unspecified trimester: Secondary | ICD-10-CM

## 2021-08-27 DIAGNOSIS — Z3482 Encounter for supervision of other normal pregnancy, second trimester: Secondary | ICD-10-CM

## 2021-08-27 NOTE — Progress Notes (Signed)
No vb. No lof. Declines genetic testing ?

## 2021-08-27 NOTE — Progress Notes (Signed)
Routine Prenatal Care Visit ? ?Subjective  ?Michelle Miller is a 31 y.o. G5P1001 at [redacted]w[redacted]d being seen today for ongoing prenatal care.  She is currently monitored for the following issues for this low-risk pregnancy and has Supervision of other normal pregnancy, antepartum on their problem list.  ?----------------------------------------------------------------------------------- ?Patient reports no complaints.   ? . Vag. Bleeding: None.  Movement: Absent. Leaking Fluid denies.  ?----------------------------------------------------------------------------------- ?The following portions of the patient's history were reviewed and updated as appropriate: allergies, current medications, past family history, past medical history, past social history, past surgical history and problem list. Problem list updated. ? ?Objective  ?Blood pressure 122/70, weight 126 lb (57.2 kg), last menstrual period 05/21/2021, unknown if currently breastfeeding. ?Pregravid weight 120 lb (54.4 kg) Total Weight Gain 6 lb (2.722 kg) ?Urinalysis: Urine Protein    Urine Glucose   ? ?Fetal Status:     Movement: Absent    ? ?General:  Alert, oriented and cooperative. Patient is in no acute distress.  ?Skin: Skin is warm and dry. No rash noted.   ?Cardiovascular: Normal heart rate noted  ?Respiratory: Normal respiratory effort, no problems with respiration noted  ?Abdomen: Soft, gravid, appropriate for gestational age. Pain/Pressure: Absent     ?Pelvic:  Cervical exam deferred        ?Extremities: Normal range of motion.     ?Mental Status: Normal mood and affect. Normal behavior. Normal judgment and thought content.  ? ?Assessment  ? ?31 y.o. G5P1001 at [redacted]w[redacted]d by  02/25/2022, by Last Menstrual Period presenting for routine prenatal visit ? ?Plan  ? ?Pregnancy 3 Problems (from 07/29/21 to present)   ? Problem Noted Resolved  ? Supervision of other normal pregnancy, antepartum 07/29/2021 by Ellwood Sayers, CNM No  ? Overview Signed 07/29/2021  4:44  PM by Ellwood Sayers, CNM  ?   ?Nursing Staff Provider  ?Office Location  [facility]    ?Language  English Anatomy US    ?Flu Vaccine   Genetic Screen  NIPS:   ?TDaP vaccine    Hgb A1C or  ?GTT Early : ?Third trimester :   ?Covid    LAB RESULTS   ?Rhogam   Blood Type AB/Positive/-- (11/03 1608)   ?Feeding Plan  Antibody Negative (11/03 1608)  ?Contraception  Rubella 8.46 (11/03 1608)  ?Circumcision  RPR Non Reactive (11/03 1608)   ?Pediatrician   HBsAg Negative (11/03 1608)   ?Support Person  HIV Non Reactive (11/03 1608)  ?Prenatal Classes  Varicella   ?  GBS  (For PCN allergy, check sensitivities)   ?BTL Consent     ?VBAC Consent  Pap    ?  Hgb Electro    ?Pelvis Tested  CF   ?   SMA   ?     ? ? ?  ?  ?  ?  ? ?Preterm labor symptoms and general obstetric precautions including but not limited to vaginal bleeding, contractions, leaking of fluid and fetal movement were reviewed in detail with the patient. ?Please refer to After Visit Summary for other counseling recommendations.  ? ?Return in about 4 weeks (around 09/24/2021) for return OB. Having dating scan today. Ordered anatomy scan for approximately 4 weeks. Discussed plan of care, exercise and diet during pregnancy.  ? ?Lamont Snowball, SNM ? ?Mirna Mires, CNM  ?08/27/2021 10:15 AM   ? ?

## 2021-08-27 NOTE — Progress Notes (Signed)
ULTRASOUND REPORT ? ?Location: [facility] ?Date of Service: 08/27/2021  ? ?Indications:dating ?Findings:  ?Singleton intrauterine pregnancy is visualized with a CRL consistent with [redacted]w[redacted]d gestation, giving an (U/S) EDD of 02/25/22. ?The (U/S) EDD is consistent with the clinically established EDD of 02/25/22. ? ?FHR: 150 BPM ?Biometery to include AC, HC, BPD c/w 19 weeks ? ?Right Ovary is normal in appearance. ?Left Ovary is normal appearance. ?Corpus luteal cyst:  is not visualized ?Survey of the adnexa demonstrates no adnexal masses. ?There is no free peritoneal fluid in the cul de sac. ? ?Impression: ?1. 14 w0d Viable Singleton Intrauterine pregnancy by U/S. ?2. (U/S) EDD is consistent with Clinically established EDD of 02/25/22. ? ?Recommendations: ?1.Clinical correlation with the patient's History and Physical Exam. ? ?Michelle Libra, MD ? ?

## 2021-09-15 ENCOUNTER — Other Ambulatory Visit: Payer: Self-pay

## 2021-09-15 ENCOUNTER — Inpatient Hospital Stay
Admission: EM | Admit: 2021-09-15 | Discharge: 2021-09-16 | DRG: 805 | Disposition: A | Discharge status: Home / Self Care | Payer: BC Managed Care – PPO | Attending: Obstetrics and Gynecology | Admitting: Obstetrics and Gynecology

## 2021-09-15 DIAGNOSIS — O039 Complete or unspecified spontaneous abortion without complication: Secondary | ICD-10-CM

## 2021-09-15 DIAGNOSIS — N883 Incompetence of cervix uteri: Secondary | ICD-10-CM | POA: Diagnosis not present

## 2021-09-15 DIAGNOSIS — Z3A16 16 weeks gestation of pregnancy: Secondary | ICD-10-CM | POA: Diagnosis not present

## 2021-09-15 DIAGNOSIS — R109 Unspecified abdominal pain: Secondary | ICD-10-CM | POA: Diagnosis not present

## 2021-09-15 DIAGNOSIS — O411220 Chorioamnionitis, second trimester, not applicable or unspecified: Secondary | ICD-10-CM | POA: Diagnosis not present

## 2021-09-15 DIAGNOSIS — O3432 Maternal care for cervical incompetence, second trimester: Principal | ICD-10-CM | POA: Diagnosis present

## 2021-09-15 NOTE — ED Provider Notes (Signed)
? ?[facility] ?Provider Note ? ? ? Event Date/Time  ? First MD Initiated Contact with Patient 09/15/21 2259   ?  (approximate) ? ? ?History  ? ?Chief Complaint ?Pregnancy Problem ? ? ?HPI ? ?Michelle Miller is a 31 y.o. female, G3P1011 at approximately 16 weeks of pregnancy who presents to the ED complaining of abdominal pain.  Patient reports that around 3 PM this afternoon she developed intermittent crampy pain in her bilateral hips and lower back similar to previous labor.  About 2 hours prior to arrival she then began to feel like something was coming out of her uterus and if she were to push, it would come out completely.  When she arrived to the ED, she began to notice a small amount of bleeding, currently states that pain is minimal.  She denies any issues with this pregnancy thus far, has had normal ultrasound and follows with [facility].  She reports a miscarriage in November of last year. ?  ? ? ?Physical Exam  ? ?Triage Vital Signs: ?ED Triage Vitals  ?Enc Vitals Group  ?   BP 09/15/21 2211 138/84  ?   Pulse Rate 09/15/21 2211 100  ?   Resp 09/15/21 2211 20  ?   Temp 09/15/21 2211 99.3 ?F (37.4 ?C)  ?   Temp Source 09/15/21 2211 Oral  ?   SpO2 09/15/21 2211 98 %  ?   Weight 09/15/21 2211 126 lb (57.2 kg)  ?   Height 09/15/21 2211 5\' 2"  (1.575 m)  ?   Head Circumference --   ?   Peak Flow --   ?   Pain Score 09/15/21 2210 3  ?   Pain Loc --   ?   Pain Edu? --   ?   Excl. in GC? --   ? ? ?Most recent vital signs: ?Vitals:  ? 09/15/21 2211  ?BP: 138/84  ?Pulse: 100  ?Resp: 20  ?Temp: 99.3 ?F (37.4 ?C)  ?SpO2: 98%  ? ? ?Constitutional: Alert and oriented. ?Eyes: Conjunctivae are normal. ?Head: Atraumatic. ?Nose: No congestion/rhinnorhea. ?Mouth/Throat: Mucous membranes are moist. ?Cardiovascular: Normal rate, regular rhythm. Grossly normal heart sounds.  2+ radial pulses bilaterally. ?Respiratory: Normal respiratory effort.  No retractions. Lungs CTAB. ?Gastrointestinal: Soft and  nontender. No distention. ?Genitourinary: Amniotic sac intact and present just past vaginal introitus. ?Musculoskeletal: No lower extremity tenderness nor edema.  ?Neurologic:  Normal speech and language. No gross focal neurologic deficits are appreciated. ? ? ? ?ED Results / Procedures / Treatments  ? ?Labs ?(all labs ordered are listed, but only abnormal results are displayed) ?Labs Reviewed  ?CBC WITH DIFFERENTIAL/PLATELET  ?BASIC METABOLIC PANEL  ?HCG, QUANTITATIVE, PREGNANCY  ? ? ? ?PROCEDURES: ? ?Critical Care performed: No ? ?Procedures ? ? ?MEDICATIONS ORDERED IN ED: ?Medications - No data to display ? ? ?IMPRESSION / MDM / ASSESSMENT AND PLAN / ED COURSE  ?I reviewed the triage vital signs and the nursing notes. ?             ?               ? ?31 y.o. female, G3P1011 at approximately 16 weeks of pregnancy, presents to the ED with intermittent crampy pain in her bilateral hips and lower back starting this afternoon, now feels like she is beginning to pass something from her vagina. ? ?Differential diagnosis includes, but is not limited to, threatened abortion, inevitable abortion, missed abortion. ? ?Patient nontoxic-appearing and in no  acute distress, vital signs are unremarkable.  With pelvic exam, what appears to be her amniotic sac is present just past the vaginal introitus, but remains intact.  Patient is comfortable with minimal bleeding at this time.  Case discussed with Dr. Gaynelle Arabian of OB/GYN, who recommends patient be transferred to labor and delivery to assist with inevitable miscarriage. ? ?  ? ? ?FINAL CLINICAL IMPRESSION(S) / ED DIAGNOSES  ? ?Final diagnoses:  ?Miscarriage  ? ? ? ?Rx / DC Orders  ? ?ED Discharge Orders   ? ? None  ? ?  ? ? ? ?Note:  This document was prepared using Dragon voice recognition software and may include unintentional dictation errors. ?  ?Chesley Noon, MD ?09/15/21 2333 ? ?

## 2021-09-15 NOTE — ED Triage Notes (Signed)
Pt arrived via POV with reports of feeling like something is coming out of her vagina, pt is currently [redacted] weeks pregnant, G3P1  is a patient at [facility]. ? ?Pt states about 1.5 hours ago she started feeling like something is coming out of her vagina, pt denies any bleeding, but feels like if she pushes she will push something out, pt c/o low back and hip pain.  Pt had miscarriage in November.  ?

## 2021-09-16 ENCOUNTER — Encounter: Payer: Self-pay | Admitting: Obstetrics and Gynecology

## 2021-09-16 DIAGNOSIS — R109 Unspecified abdominal pain: Secondary | ICD-10-CM | POA: Diagnosis present

## 2021-09-16 DIAGNOSIS — N883 Incompetence of cervix uteri: Secondary | ICD-10-CM

## 2021-09-16 DIAGNOSIS — Z3A16 16 weeks gestation of pregnancy: Secondary | ICD-10-CM

## 2021-09-16 DIAGNOSIS — O039 Complete or unspecified spontaneous abortion without complication: Secondary | ICD-10-CM | POA: Diagnosis not present

## 2021-09-16 DIAGNOSIS — O3432 Maternal care for cervical incompetence, second trimester: Secondary | ICD-10-CM

## 2021-09-16 LAB — BASIC METABOLIC PANEL
Anion gap: 6 (ref 5–15)
BUN: 11 mg/dL (ref 6–20)
CO2: 24 mmol/L (ref 22–32)
Calcium: 9.3 mg/dL (ref 8.9–10.3)
Chloride: 102 mmol/L (ref 98–111)
Creatinine, Ser: 0.87 mg/dL (ref 0.44–1.00)
GFR, Estimated: >60 mL/min (ref >60)
Glucose, Bld: 102 mg/dL — ABNORMAL HIGH (ref 70–99)
Potassium: 3.7 mmol/L (ref 3.5–5.1)
Sodium: 132 mmol/L — ABNORMAL LOW (ref 135–145)

## 2021-09-16 LAB — CBC WITH DIFFERENTIAL/PLATELET
Abs Immature Granulocytes: 0.11 10*3/uL — ABNORMAL HIGH (ref 0.00–0.07)
Basophils Absolute: 0.1 10*3/uL (ref 0.0–0.1)
Basophils Relative: 0 %
Eosinophils Absolute: 0.1 10*3/uL (ref 0.0–0.5)
Eosinophils Relative: 0 %
HCT: 31.1 % — ABNORMAL LOW (ref 36.0–46.0)
Hemoglobin: 10.5 g/dL — ABNORMAL LOW (ref 12.0–15.0)
Immature Granulocytes: 1 %
Lymphocytes Relative: 10 %
Lymphs Abs: 2 10*3/uL (ref 0.7–4.0)
MCH: 28.7 pg (ref 26.0–34.0)
MCHC: 33.8 g/dL (ref 30.0–36.0)
MCV: 85 fL (ref 80.0–100.0)
Monocytes Absolute: 0.8 10*3/uL (ref 0.1–1.0)
Monocytes Relative: 4 %
Neutro Abs: 17 10*3/uL — ABNORMAL HIGH (ref 1.7–7.7)
Neutrophils Relative %: 85 %
Platelets: 266 10*3/uL (ref 150–400)
RBC: 3.66 MIL/uL — ABNORMAL LOW (ref 3.87–5.11)
RDW: 12.8 % (ref 11.5–15.5)
WBC: 20 10*3/uL — ABNORMAL HIGH (ref 4.0–10.5)
nRBC: 0 % (ref 0.0–0.2)

## 2021-09-16 LAB — RAPID HIV SCREEN (HIV 1/2 AB+AG)
HIV 1/2 Antibodies: NONREACTIVE
HIV-1 P24 Antigen - HIV24: NONREACTIVE

## 2021-09-16 LAB — CBC
HCT: 26.6 % — ABNORMAL LOW (ref 36.0–46.0)
Hemoglobin: 9.2 g/dL — ABNORMAL LOW (ref 12.0–15.0)
MCH: 29 pg (ref 26.0–34.0)
MCHC: 34.6 g/dL (ref 30.0–36.0)
MCV: 83.9 fL (ref 80.0–100.0)
Platelets: 243 10*3/uL (ref 150–400)
RBC: 3.17 MIL/uL — ABNORMAL LOW (ref 3.87–5.11)
RDW: 12.6 % (ref 11.5–15.5)
WBC: 29.1 10*3/uL — ABNORMAL HIGH (ref 4.0–10.5)
nRBC: 0 % (ref 0.0–0.2)

## 2021-09-16 LAB — CHLAMYDIA/NGC RT PCR (ARMC ONLY)
Chlamydia Tr: NOT DETECTED
N gonorrhoeae: NOT DETECTED

## 2021-09-16 LAB — HCG, QUANTITATIVE, PREGNANCY: hCG, Beta Chain, Quant, S: 27623 m[IU]/mL — ABNORMAL HIGH (ref <5)

## 2021-09-16 MED ORDER — LIDOCAINE HCL (PF) 1 % IJ SOLN
INTRAMUSCULAR | Status: AC
  Filled 2021-09-16: qty 30

## 2021-09-16 MED ORDER — COCONUT OIL OIL
1.0000 | TOPICAL_OIL | Status: DC | PRN
Start: 2021-09-16 — End: 2021-09-16

## 2021-09-16 MED ORDER — OXYTOCIN 10 UNIT/ML IJ SOLN: 10.0000 [IU] | Freq: Once | INTRAMUSCULAR | Status: AC

## 2021-09-16 MED ORDER — IBUPROFEN 600 MG PO TABS
600.0000 mg | ORAL_TABLET | Freq: Four times a day (QID) | ORAL | Status: DC
  Administered 2021-09-16: 600 mg via ORAL
  Filled 2021-09-16: qty 1

## 2021-09-16 MED ORDER — OXYTOCIN-SODIUM CHLORIDE 30-0.9 UT/500ML-% IV SOLN
2.5000 [IU]/h | INTRAVENOUS | Status: DC
  Filled 2021-09-16: qty 500

## 2021-09-16 MED ORDER — OXYTOCIN 10 UNIT/ML IJ SOLN
INTRAMUSCULAR | Status: AC
  Administered 2021-09-16: 10 [IU] via INTRAMUSCULAR
  Filled 2021-09-16: qty 2

## 2021-09-16 MED ORDER — ZOLPIDEM TARTRATE 5 MG PO TABS: 5.0000 mg | ORAL_TABLET | Freq: Every evening | ORAL | Status: DC | PRN

## 2021-09-16 MED ORDER — AMMONIA AROMATIC IN INHA
RESPIRATORY_TRACT | Status: AC
  Filled 2021-09-16: qty 10

## 2021-09-16 MED ORDER — ONDANSETRON HCL 4 MG/2ML IJ SOLN: 4.0000 mg | Freq: Four times a day (QID) | INTRAMUSCULAR | Status: DC | PRN

## 2021-09-16 MED ORDER — ONDANSETRON HCL 4 MG/2ML IJ SOLN
4.0000 mg | INTRAMUSCULAR | Status: DC | PRN
Start: 2021-09-16 — End: 2021-09-16

## 2021-09-16 MED ORDER — SOD CITRATE-CITRIC ACID 500-334 MG/5ML PO SOLN: 30.0000 mL | ORAL | Status: DC | PRN

## 2021-09-16 MED ORDER — DIPHENHYDRAMINE HCL 25 MG PO CAPS: 25.0000 mg | ORAL_CAPSULE | Freq: Four times a day (QID) | ORAL | Status: DC | PRN

## 2021-09-16 MED ORDER — DOCUSATE SODIUM 100 MG PO CAPS: 100.0000 mg | ORAL_CAPSULE | Freq: Two times a day (BID) | ORAL | Status: DC

## 2021-09-16 MED ORDER — LACTATED RINGERS IV SOLN: INTRAVENOUS | Status: DC

## 2021-09-16 MED ORDER — SIMETHICONE 80 MG PO CHEW: 80.0000 mg | CHEWABLE_TABLET | ORAL | Status: DC | PRN

## 2021-09-16 MED ORDER — MISOPROSTOL 200 MCG PO TABS
600.0000 ug | ORAL_TABLET | Freq: Once | ORAL | Status: AC
Start: 2021-09-16 — End: 2021-09-16

## 2021-09-16 MED ORDER — MISOPROSTOL 200 MCG PO TABS
ORAL_TABLET | ORAL | Status: AC
  Administered 2021-09-16: 600 ug via ORAL
  Filled 2021-09-16: qty 4

## 2021-09-16 MED ORDER — PRENATAL MULTIVITAMIN CH: 1.0000 | ORAL_TABLET | Freq: Every day | ORAL | Status: DC

## 2021-09-16 MED ORDER — LACTATED RINGERS IV SOLN: 500.0000 mL | INTRAVENOUS | Status: DC | PRN

## 2021-09-16 MED ORDER — ACETAMINOPHEN 500 MG PO TABS
1000.0000 mg | ORAL_TABLET | Freq: Four times a day (QID) | ORAL | Status: DC
  Administered 2021-09-16: 1000 mg via ORAL
  Filled 2021-09-16: qty 2

## 2021-09-16 MED ORDER — OXYTOCIN-SODIUM CHLORIDE 30-0.9 UT/500ML-% IV SOLN
INTRAVENOUS | Status: AC
  Filled 2021-09-16: qty 500

## 2021-09-16 MED ORDER — BENZOCAINE-MENTHOL 20-0.5 % EX AERO: 1.0000 "application " | INHALATION_SPRAY | CUTANEOUS | Status: DC | PRN

## 2021-09-16 MED ORDER — ACETAMINOPHEN 325 MG PO TABS: 650.0000 mg | ORAL_TABLET | ORAL | Status: DC | PRN

## 2021-09-16 MED ORDER — DIBUCAINE (PERIANAL) 1 % EX OINT: 1.0000 "application " | TOPICAL_OINTMENT | CUTANEOUS | Status: DC | PRN

## 2021-09-16 MED ORDER — LIDOCAINE HCL (PF) 1 % IJ SOLN: 30.0000 mL | INTRAMUSCULAR | Status: DC | PRN

## 2021-09-16 MED ORDER — BUTORPHANOL TARTRATE 1 MG/ML IJ SOLN: 2.0000 mg | INTRAMUSCULAR | Status: DC | PRN

## 2021-09-16 MED ORDER — OXYTOCIN BOLUS FROM INFUSION
333.0000 mL | Freq: Once | INTRAVENOUS | Status: DC
Start: 2021-09-16 — End: 2021-09-16
  Administered 2021-09-16: 333 mL via INTRAVENOUS

## 2021-09-16 MED ORDER — WITCH HAZEL-GLYCERIN EX PADS: 1.0000 "application " | MEDICATED_PAD | CUTANEOUS | Status: DC | PRN

## 2021-09-16 MED ORDER — ONDANSETRON HCL 4 MG PO TABS: 4.0000 mg | ORAL_TABLET | ORAL | Status: DC | PRN

## 2021-09-16 NOTE — Discharge Summary (Signed)
RN reviewed discharge instructions with patient. Gave pt opportunity for questions. All questions answered at this time. Pt verbalized understanding. RN to call nursing supervisor to pick up IUFD. Volunteer services called to help with discharge. Pt discharged home with her mother.  ?

## 2021-09-16 NOTE — Discharge Summary (Signed)
? ?  Postpartum Discharge Summary ? ?Date of Service updated- 09/16/2021 ?   ?Patient Name: Michelle Miller ?DOB: [birth year] ?MRN: [id number] ? ?Date of admission: 09/15/2021 ?Delivery date:09/16/2021  ?Delivering provider: Adrian Prows R  ?Date of discharge: 09/16/2021 ? ?Admitting diagnosis: Miscarriage [O03.9] ?Preterm labor in second trimester [O60.02] ?Intrauterine pregnancy: [redacted]w[redacted]d    ?Secondary diagnosis:  Principal Problem: ?  Preterm labor in second trimester ?Active Problems: ?  Cervical incompetence ? ?Additional problems: None    ?Discharge diagnosis: Preterm Pregnancy Delivered                                              ?Post partum procedures: none ?Augmentation: N/A ?Complications: None ? ?Hospital course: Onset of Labor With Vaginal Delivery      ?31y.o. yo [id number] 178w6das admitted in Active Labor on 09/15/2021. Patient had an uncomplicated labor course as follows:  ?Membrane Rupture Time/Date: 12:28 AM ,09/16/2021   ?Delivery Method:Vaginal, Spontaneous  ?Episiotomy: None  ?Lacerations:  None  ?Patient had an uncomplicated postpartum course.  She is ambulating, tolerating a regular diet, passing flatus, and urinating well. Patient is discharged home in stable condition on 09/16/21. ? ?Newborn Data: ?Birth date:09/16/2021  ?Birth time:12:37 AM  ?Gender:  ?Living status:Living  ?Apgars:2 ,0  ?Weight:170 g  ? ?Magnesium Sulfate received: No ?BMZ received: No ?Rhophylac:No ?MMR:No ?T-DaP: none ?Flu: No ?Transfusion:No ? ?Physical exam  ?Vitals:  ? 09/16/21 0055 09/16/21 0330 09/16/21 0500 09/16/21 [id number]?BP: 103/63 (!) 102/53 (!) 110/59 (!) 102/53  ?Pulse: 92 (!) 102 (!) 104 (!) 103  ?Resp:      ?Temp: 99 ?F (37.2 ?C)     ?TempSrc: Axillary     ?SpO2:      ?Weight:      ?Height:      ? ?General: alert and cooperative ?Lochia: appropriate ?Uterine Fundus: firm ?Incision: Healing well with no significant drainage ?DVT Evaluation: No evidence of DVT seen on physical exam. ?Labs: ?Lab Results   ?Component Value Date  ? WBC 29.1 (H) 09/16/2021  ? HGB 9.2 (L) 09/16/2021  ? HCT 26.6 (L) 09/16/2021  ? MCV 83.9 09/16/2021  ? PLT 243 09/16/2021  ? ? ?  Latest Ref Rng & Units 09/15/2021  ? 11:53 PM  ?CMP  ?Glucose 70 - 99 mg/dL 102    ?BUN 6 - 20 mg/dL 11    ?Creatinine 0.44 - 1.00 mg/dL 0.87    ?Sodium 135 - 145 mmol/L 132    ?Potassium 3.5 - 5.1 mmol/L 3.7    ?Chloride 98 - 111 mmol/L 102    ?CO2 22 - 32 mmol/L 24    ?Calcium 8.9 - 10.3 mg/dL 9.3    ? ?Edinburgh Score: ? ?  12/30/2017  ?  3:00 PM  ?Edinburgh Postnatal Depression Scale Screening Tool  ?I have been able to laugh and see the funny side of things. 0  ?I have looked forward with enjoyment to things. 0  ?I have blamed myself unnecessarily when things went wrong. 1  ?I have been anxious or worried for no good reason. 2  ?I have felt scared or panicky for no good reason. 0  ?Things have been getting on top of me. 1  ?I have been so unhappy that I have had difficulty sleeping. 0  ?I have felt sad or miserable. 1  ?I have  been so unhappy that I have been crying. 0  ?The thought of harming myself has occurred to me. 0  ?Edinburgh Postnatal Depression Scale Total 5  ? ? ? ? ?After visit meds:  ?Allergies as of 09/16/2021   ?No Known Allergies ?  ? ?  ?Medication List  ?  ? ?TAKE these medications   ? ?Prenatal 19 29-1 MG Chew ?Chew by mouth. ?  ? ?  ? ?  ?  ? ? ?  ?Discharge Care Instructions  ?(From admission, onward)  ?  ? ? ?  ? ?  Start     Ordered  ? 09/16/21 0000  Discharge wound care:       ?Comments: SHOWER DAILY ?Wash incision gently with soap and water.  ?Call office with any drainage, redness, or firmness of the incision.  ? 09/16/21 0731  ? ?  ?  ? ?  ? ? ? ?Discharge home in stable condition ?Infant Feeding:  not applicable ?Infant Disposition:morgue ?Discharge instruction: per After Visit Summary and Postpartum booklet. ?Activity: Advance as tolerated. Pelvic rest for 6 weeks.  ?Diet: routine diet ?Anticipated Birth Control: Unsure ?Postpartum  Appointment:2 weeks ?Additional Postpartum F/U:  Depression check ?Future Appointments: ?Future Appointments  ?Date Time Provider [place]  ?09/23/2021 11:00 AM [facility]-US OP[facility]-US [facility]-Outpati  ?09/24/2021  8:55 AM Imagene Riches, CNM [facility] None  ? ?Follow up Visit: ? Follow-up Information   ? ? [place].   ?Specialty: Emergency Medicine ?Why: If symptoms worsen ?Contact information: ?[id number] ?[place] ?[phone] ? ?  ?  ? ? Schedule an appointment as soon as possible for a visit  with [place].   ?Contact information: ?[address] ?[place] [address] ?[phone] ? ? ?  ?  ? ?  ?  ? ?  ? ? ?09/16/2021 ?Homero Fellers, MD ? ? ?

## 2021-09-16 NOTE — Progress Notes (Signed)
Placenta delivered spontaneously a few hours after vaginal birth.  ?No lacerations, and since delivery of placenta bleeding has been minimal to moderate. Patient is stable.  ?Bedside US does not suggest any retained tissue.  ?She desires discharge home.  ? ?Adelene Idler MD, FACOG ?[facility], [facility] ?09/16/2021 ?7:29 AM ? ?

## 2021-09-16 NOTE — H&P (Signed)
?Michelle Miller is an 30 y.o. female.   ?Chief Complaint: Abdominal pain and pelvic pressure ?HPI: he presents today with complaints of pelvic pressure. She reports that she started having this pain around 8 pm this evening. She was seen in the ER and was noted to have visible membranes so she was brought to labor and delivery.  ? ?Past Medical History:  ?Diagnosis Date  ? 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 ? ?Past Surgical History:  ?Procedure Laterality Date  ? NO PAST SURGERIES    ? ? ?Family History  ?Problem Relation Age of Onset  ? Diabetes Mother   ? Heart attack Mother   ? ?Social History:  reports that she has never smoked. She has never used smokeless tobacco. She reports that she does not currently use alcohol. She reports that she does not use drugs. ? ?Allergies: No Known Allergies ? ?Medications Prior to Admission  ?Medication Sig Dispense Refill  ? Prenatal Vit-Fe Fumarate-FA (PRENATAL 19) 29-1 MG CHEW Chew by mouth.    ? ? ?No results found for this or any previous visit (from the past 48 hour(s)). ?No results found. ? ?Review of Systems  ?Constitutional:  Negative for chills and fever.  ?HENT:  Negative for congestion, hearing loss and sinus pain.   ?Respiratory:  Negative for cough, shortness of breath and wheezing.   ?Cardiovascular:  Negative for chest pain, palpitations and leg swelling.  ?Gastrointestinal:  Negative for abdominal pain, constipation, diarrhea, nausea and vomiting.  ?Genitourinary:  Negative for dysuria, flank pain, frequency, hematuria and urgency.  ?Musculoskeletal:  Negative for back pain.  ?Skin:  Negative for rash.  ?Neurological:  Negative for dizziness and headaches.  ?Psychiatric/Behavioral:  Negative for suicidal ideas. The patient is not nervous/anxious.   ? ?Blood pressure 138/84, pulse 100, temperature 99.3 ?F (37.4 ?C), temperature source Oral, resp. rate 20, height 5\' 2"  (1.575 m), weight 57.2 kg, last menstrual period 05/21/2021, SpO2 98 %, unknown if  currently breastfeeding. ?Physical Exam ?Vitals and nursing note reviewed.  ?Constitutional:   ?   Appearance: Normal appearance. She is well-developed.  ?HENT:  ?   Head: Normocephalic and atraumatic.  ?Cardiovascular:  ?   Rate and Rhythm: Normal rate and regular rhythm.  ?Pulmonary:  ?   Effort: Pulmonary effort is normal.  ?   Breath sounds: Normal breath sounds.  ?Abdominal:  ?   General: Bowel sounds are normal.  ?   Palpations: Abdomen is soft.  ?Genitourinary: ?   Comments: Membranes noted to be prolapsing to the introitus. Baby noted in the vagina.  ?Musculoskeletal:     ?   General: Normal range of motion.  ?Skin: ?   General: Skin is warm and dry.  ?Neurological:  ?   Mental Status: She is alert and oriented to person, place, and time.  ?Psychiatric:     ?   Behavior: Behavior normal.     ?   Thought Content: Thought content normal.     ?   Judgment: Judgment normal.  ?  ?Bedside  05/23/2021: ?Infant low in the pelvis. Vertex ?FHR: 153 bpm ? ?Assessment/Plan ?31 yo [redacted]w[redacted]d  here with painless cervical dilation and delivery of infant shortly after arriving on the labor and delivery floor.  ? ?Patient given cytotec, awaiting delivery of placenta ? ?Condolences offered to the patient.  ?Discussed option for autopsy should she desire further investigation of the fetal loss. Infant grossly normal.  ? ? ?[redacted]w[redacted]d, MD ?09/16/2021,  12:07 AM ? ? ? ?

## 2021-09-18 LAB — SURGICAL PATHOLOGY

## 2021-09-19 NOTE — Progress Notes (Addendum)
09/18/21: In reference to fetal death. Decedent was received on 09/16/21 at 0940. [facility] was contacted by Alwyn Ren RN, Administrative Coordinator on 09/16/21 at 1045 ?

## 2021-09-23 ENCOUNTER — Ambulatory Visit: Payer: BC Managed Care – PPO

## 2021-09-23 NOTE — Progress Notes (Signed)
Fetal remains of baby Klepper released to [employer] for [employer] on 09/23/21 1041 am ?

## 2021-09-23 NOTE — Progress Notes (Signed)
09/23/21: Remains still here. Called [facility]. They stated they were called and told "the paperwork was messed up and they couldn't pick the baby up". I explained the baby needed to be picked up very soon. ?

## 2021-09-23 NOTE — Progress Notes (Deleted)
09/23/21 1038am  Fetal remains of Michelle Miller picked up by [employer] for [employer]. ?

## 2021-09-24 ENCOUNTER — Ambulatory Visit (INDEPENDENT_AMBULATORY_CARE_PROVIDER_SITE_OTHER): Payer: BC Managed Care – PPO | Admitting: Obstetrics

## 2021-09-24 NOTE — Progress Notes (Signed)
? ? ?Obstetrics & Gynecology Office Visit  ? ?Chief Complaint:  ?Chief Complaint  ?Patient presents with  ? Follow-up  ? ? ?History of Present Illness: Michelle Miller presents at 29 days post delivery of a 16 week fetus. She had started feeling vaginal pressure on 4/10 and went to the ED where membranes were noted in the vagina. She was moved to labor and Delivery where she delivered her baby in the sac. Dr. Gardiner Rhyme attended the delivery. She reports today for a mood check. She is appropriately grieving this loss. Of note, she has a hx of an 8 week SAB in 04/2021. ?Today she has only light vaginal bleeding, no cramping. She is desrous of another pregnancy but wants to wait about 6 months before attempting another pregnancy. ?She is taking daily PNVs and iron. Would like to contracept for several months before trying again for another  pregnancy. ?  ?Review of Systems:  ?Review of Systems  ?Constitutional: Negative.   ?HENT: Negative.    ?Eyes: Negative.   ?Respiratory: Negative.    ?Cardiovascular: Negative.   ?Gastrointestinal: Negative.   ?Genitourinary: Negative.   ?Musculoskeletal: Negative.   ?Skin: Negative.   ?Neurological: Negative.   ?Endo/Heme/Allergies: Negative.   ?Psychiatric/Behavioral:    ?     Grieving. PHQ 3 ?GAD 5 ? ?Edinburgh 7   ? ?Past Medical History:  ?Past Medical History:  ?Diagnosis Date  ? 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 ? ?Past Surgical History:  ?Past Surgical History:  ?Procedure Laterality Date  ? NO PAST SURGERIES    ? ? ?Gynecologic History: No LMP recorded. ? ?Obstetric History: [id number] ? ?Family History:  ?Family History  ?Problem Relation Age of Onset  ? Diabetes Mother   ? Heart attack Mother   ? ? ?Social History:  ?Social History  ? ?Socioeconomic History  ? Marital status: Single  ?  Spouse name: Not on file  ? Number of children: Not on file  ? Years of education: Not on file  ? Highest education level: Not on file  ?Occupational History  ? Not on file  ?Tobacco Use  ?  Smoking status: Never  ? Smokeless tobacco: Never  ?Vaping Use  ? Vaping Use: Never used  ?Substance and Sexual Activity  ? Alcohol use: Not Currently  ?  Comment: social  ? Drug use: No  ? Sexual activity: Yes  ?  Birth control/protection: None  ?Other Topics Concern  ? Not on file  ?Social History Narrative  ? Not on file  ? ?Social Determinants of Health  ? ?Financial Resource Strain: Not on file  ?Food Insecurity: Not on file  ?Transportation Needs: Not on file  ?Physical Activity: Not on file  ?Stress: Not on file  ?Social Connections: Not on file  ?Intimate Partner Violence: Not on file  ? ? ?Allergies:  ?No Known Allergies ? ?Medications: ?Prior to Admission medications   ?Medication Sig Start Date End Date Taking? Authorizing Provider  ?Prenatal Vit-Fe Fumarate-FA (PRENATAL 19) 29-1 MG CHEW Chew by mouth.    [provider]  ? ? ?Physical Exam ?Vitals:  ?Vitals:  ? 09/24/21 0842  ?BP: 110/64  ? ?No LMP recorded. ? ?Physical Exam ?Vitals reviewed.  ?Constitutional:   ?   Appearance: Normal appearance. She is normal weight.  ?HENT:  ?   Head: Normocephalic and atraumatic.  ?   Nose: Nose normal.  ?Cardiovascular:  ?   Rate and Rhythm: Normal rate and regular rhythm.  ?Pulmonary:  ?  Effort: Pulmonary effort is normal.  ?   Breath sounds: Normal breath sounds.  ?Abdominal:  ?   General: Abdomen is flat.  ?   Palpations: Abdomen is soft.  ?Genitourinary: ?   Comments: Exam deferred. ?She reports limited vaginal bleeding. ?Musculoskeletal:  ?   Cervical back: Normal range of motion and neck supple.  ?Skin: ?   General: Skin is warm and dry.  ?Neurological:  ?   General: No focal deficit present.  ?   Mental Status: She is alert and oriented to person, place, and time.  ? ? ? ?Assessment: 31 y.o. [id number] for 1 week post  [redacted] weeks GA loss. ? ?Plan: ?Problem List Items Addressed This Visit   ?None ? ?Time spent listening to her talk about this experience. She will RTC in 4 weeks for PE and will use [facility]  for [facility] for several months. We discussed option of an MFM referral once she conceives again. ? ?Imagene Riches, CNM  ?09/24/2021 9:20 AM  ? ? ? ?

## 2021-10-16 ENCOUNTER — Encounter: Payer: Self-pay | Admitting: Obstetrics and Gynecology

## 2021-10-23 ENCOUNTER — Ambulatory Visit: Payer: BC Managed Care – PPO | Admitting: Obstetrics

## 2021-10-29 ENCOUNTER — Encounter: Payer: Self-pay | Admitting: Obstetrics

## 2021-10-29 ENCOUNTER — Ambulatory Visit (INDEPENDENT_AMBULATORY_CARE_PROVIDER_SITE_OTHER): Payer: BC Managed Care – PPO | Admitting: Obstetrics

## 2021-10-29 DIAGNOSIS — Z8759 Personal history of other complications of pregnancy, childbirth and the puerperium: Secondary | ICD-10-CM | POA: Diagnosis not present

## 2021-10-29 DIAGNOSIS — O039 Complete or unspecified spontaneous abortion without complication: Secondary | ICD-10-CM | POA: Diagnosis not present

## 2021-10-29 NOTE — Progress Notes (Signed)
Postpartum Visit  Chief Complaint:  Chief Complaint  Patient presents with   Postpartum Care    History of Present Illness: Patient is a 31 y.o. [id number] presents for postpartum visit.She had a 16 week loss, arriving to the hosptial with bulging membranes. She is upbeat today; reports that she has good family support and is trying to stay busy. She would like to get pregnant again, and is looking to conceive in about 4 months. Her loss is the second; she had an SAB one year ago at [redacted] wks gestation.  Date of delivery: 09/25/2021 Type of delivery: Vaginal delivery - Vacuum or forceps assisted  no Episiotomy No.  Laceration: no  Pregnancy or labor problems:  fetal loss Any problems since the delivery:  no  Newborn Details:  SINGLETON :  1 Her baby did have a heart beat at delivery, but passed away shortly thereafter.  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 Authorizing Provider  Prenatal Vit-Fe Fumarate-FA (PRENATAL 19) 29-1 MG CHEW Chew by mouth.   Yes [provider]    No Known Allergies   Social History   Socioeconomic History   Marital status: Single    Spouse name: Not on file   Number of children: Not on file   Years of education: Not on file   Highest education level: Not on file  Occupational History   Not on file  Tobacco Use   Smoking status: Never   Smokeless tobacco: Never  Vaping Use   Vaping Use: Never used  Substance and Sexual Activity   Alcohol use: Not Currently    Comment: social   Drug use: No   Sexual activity: Yes    Birth control/protection: None  Other Topics Concern   Not on file  Social History Narrative   Not on file   Social Determinants of Health   Financial Resource Strain: Not on file  Food Insecurity: Not on file   Transportation Needs: Not on file  Physical Activity: Not on file  Stress: Not on file  Social Connections: Not on file  Intimate Partner Violence: Not on file    Family History  Problem Relation Age of Onset   Diabetes Mother    Heart attack Mother     Review of Systems  Constitutional: Negative.   HENT: Negative.    Eyes: Negative.   Respiratory: Negative.    Cardiovascular: Negative.   Gastrointestinal: Negative.   Genitourinary: Negative.   Musculoskeletal: Negative.   Skin: Negative.   Neurological: Negative.   Endo/Heme/Allergies: Negative.   Psychiatric/Behavioral: Negative.      Physical Exam BP 118/70   Ht 5\' 2"  (1.575 m)   Wt 126 lb (57.2 kg)   LMP 10/11/2021 (Exact Date)   BMI 23.05 kg/m   Physical Exam Constitutional:      Appearance: Normal appearance. She is obese.  Genitourinary:     Vulva and rectum normal.     Genitourinary Comments: Uterus is involuted. Midline, anteverted, no CMT, no adnexal masses. Cervix still soft, feels slightly open at the external os.  HENT:     Head: Normocephalic and atraumatic.  Cardiovascular:     Rate and Rhythm: Normal rate and regular rhythm.     Pulses: Normal pulses.  Heart sounds: Normal heart sounds.  Pulmonary:     Effort: Pulmonary effort is normal.     Breath sounds: Normal breath sounds.  Abdominal:     General: Abdomen is flat.     Palpations: Abdomen is soft.  Musculoskeletal:        General: Normal range of motion.     Cervical back: Normal range of motion and neck supple.  Neurological:     General: No focal deficit present.     Mental Status: She is alert and oriented to person, place, and time.  Skin:    General: Skin is warm and dry.  Psychiatric:        Mood and Affect: Mood normal.        Behavior: Behavior normal.     Female Chaperone present during breast and/or pelvic exam.  Assessment: 31 y.o. [id number] presenting for 6 week postpartum visit  Plan: Problem List Items  Addressed This Visit   None    1) Contraception Education given regarding options for contraception, including barrier methods. Sheis advised to remain on daily vitamin with folic acid.  2)  Pap - ASCCP guidelines and rational discussed.  Patient opts for annual screening interval  3) Patient underwent screening for postpartum depression with  no concerns noted.  4) Follow up in several montths with an MD to discuss hr hx of two losses. She would request MFM consultation with next pregnancy, and we discussed evaluation for cerclage based on their recommendation.  Imagene Riches, CNM  10/29/2021 10:12 AM   10/29/2021 9:58 AM

## 2022-01-13 ENCOUNTER — Ambulatory Visit: Payer: BC Managed Care – PPO

## 2022-01-13 ENCOUNTER — Ambulatory Visit (INDEPENDENT_AMBULATORY_CARE_PROVIDER_SITE_OTHER): Payer: BC Managed Care – PPO

## 2022-01-13 VITALS — Ht 62.0 in | Wt 127.0 lb

## 2022-01-13 DIAGNOSIS — Z3200 Encounter for pregnancy test, result unknown: Secondary | ICD-10-CM

## 2022-01-13 LAB — POCT URINE PREGNANCY: Preg Test, Ur: POSITIVE — AB

## 2022-01-13 NOTE — Progress Notes (Deleted)
Patient presents for evaluation of amenorrhea. She believes she could be pregnant.  Current symptoms also include, Urine HCG: Briefly discussed pre-natal care options. She will schedule her appointments at check out.

## 2022-01-13 NOTE — Progress Notes (Signed)
Patient presents for evaluation of amenorrhea. She believes she could be pregnant.  Current has had no symptoms. Urine HCG:POS  She will schedule her appointments at check out.

## 2022-01-31 ENCOUNTER — Telehealth: Payer: Self-pay | Admitting: Obstetrics

## 2022-01-31 ENCOUNTER — Ambulatory Visit (INDEPENDENT_AMBULATORY_CARE_PROVIDER_SITE_OTHER): Payer: BC Managed Care – PPO

## 2022-01-31 VITALS — Wt 127.0 lb

## 2022-01-31 DIAGNOSIS — O0990 Supervision of high risk pregnancy, unspecified, unspecified trimester: Secondary | ICD-10-CM

## 2022-01-31 DIAGNOSIS — Z3480 Encounter for supervision of other normal pregnancy, unspecified trimester: Secondary | ICD-10-CM

## 2022-01-31 DIAGNOSIS — Z369 Encounter for antenatal screening, unspecified: Secondary | ICD-10-CM

## 2022-01-31 DIAGNOSIS — Z3A00 Weeks of gestation of pregnancy not specified: Secondary | ICD-10-CM

## 2022-01-31 HISTORY — DX: Supervision of high risk pregnancy, unspecified, unspecified trimester: O09.90

## 2022-01-31 NOTE — Telephone Encounter (Signed)
Reached out to pt to schedule dating [place] here at [facility].  Left message for pt to call back to schedule.

## 2022-01-31 NOTE — Telephone Encounter (Signed)
Pt is schedule for [place] at [facility] on 9/14 at 1:00.

## 2022-01-31 NOTE — Progress Notes (Signed)
New OB Intake  I connected with  Michelle Miller on 01/31/22 at 10:15 AM EDT by telephone and verified that I am speaking with the correct person using two identifiers. Nurse is located at [facility] and pt is located at home.  I explained I am completing New OB Intake today. We discussed her EDD of 09/19/2022 that is based on LMP of 12/13/2021. Pt is G4/P1021. I reviewed her allergies, medications, Medical/Surgical/OB history, and appropriate screenings. Based on history, this is a/an pregnancy uncomplicated .   Patient Active Problem List   Diagnosis Date Noted   Supervision of other normal pregnancy, antepartum 01/31/2022   History of perinatal fetal loss 10/29/2021   Preterm labor in second trimester 09/16/2021   Cervical incompetence 09/16/2021    Concerns addressed today Pt desires dating u/s d/t prev miscarriage and fetal demise after birth; adv will order.  Delivery Plans:  Plans to deliver at [facility].  Anatomy US Explained [place] will be scheduled for dating and will also have an anatomy scan at 20 weeks.  Labs Discussed genetic screening with patient. Patient declines genetic testing to be drawn at new OB visit. Discussed possible labs to be drawn at new OB appointment.  COVID Vaccine Patient has not had COVID vaccine.   Social Determinants of Health Food Insecurity: denies food insecurity Transportation: Patient denies transportation needs. Childcare: Discussed no children allowed at ultrasound appointments.   First visit review I reviewed new OB appt with pt. I explained she will have ob bloodwork and pap smear/pelvic exam if indicated. Explained pt will be seen by Paula Compton, CNM at first visit; encounter routed to appropriate provider.   Loran Senters, [facility] 01/31/2022  10:47 AM  Clinical Staff Provider  Office Location  [facility] Dating    Language  English Anatomy US    Flu Vaccine  offer Genetic Screen  NIPS:   TDaP vaccine   offer  Hgb A1C or  GTT Early : Third trimester :   Covid declined   LAB RESULTS   Rhogam   Blood Type AB/Positive/-- (02/20 1114)   Feeding Plan breast Antibody Negative (02/20 1114)  Contraception depo Rubella 7.10 (02/20 1114)  Circumcision yes RPR Non Reactive (02/20 1114)   Pediatrician  [facility] [place] HBsAg Negative (02/20 1114)   Support Person Jalen HIV NON REACTIVE (04/09 2353)  Prenatal Classes no Varicella     GBS  (For PCN allergy, check sensitivities)   BTL Consent  Hep C   VBAC Consent  Pap      Hgb Electro      CF      SMA

## 2022-02-17 ENCOUNTER — Other Ambulatory Visit: Payer: Self-pay | Admitting: Obstetrics

## 2022-02-17 DIAGNOSIS — Z3480 Encounter for supervision of other normal pregnancy, unspecified trimester: Secondary | ICD-10-CM

## 2022-02-17 DIAGNOSIS — Z369 Encounter for antenatal screening, unspecified: Secondary | ICD-10-CM

## 2022-02-20 ENCOUNTER — Ambulatory Visit (INDEPENDENT_AMBULATORY_CARE_PROVIDER_SITE_OTHER): Payer: BC Managed Care – PPO

## 2022-02-20 ENCOUNTER — Other Ambulatory Visit: Payer: BC Managed Care – PPO

## 2022-02-20 DIAGNOSIS — Z369 Encounter for antenatal screening, unspecified: Secondary | ICD-10-CM

## 2022-02-20 DIAGNOSIS — Z3480 Encounter for supervision of other normal pregnancy, unspecified trimester: Secondary | ICD-10-CM

## 2022-02-21 ENCOUNTER — Other Ambulatory Visit: Payer: BC Managed Care – PPO

## 2022-02-21 LAB — CBC/D/PLT+RPR+RH+ABO+RUBIGG...
Antibody Screen: NEGATIVE
Basophils Absolute: 0 10*3/uL (ref 0.0–0.2)
Basos: 0 %
EOS (ABSOLUTE): 0.1 10*3/uL (ref 0.0–0.4)
Eos: 1 %
HCV Ab: NONREACTIVE
HIV Screen 4th Generation wRfx: NONREACTIVE
Hematocrit: 35.1 % (ref 34.0–46.6)
Hemoglobin: 11.3 g/dL (ref 11.1–15.9)
Hepatitis B Surface Ag: NEGATIVE
Immature Grans (Abs): 0 10*3/uL (ref 0.0–0.1)
Immature Granulocytes: 0 %
Lymphocytes Absolute: 1.9 10*3/uL (ref 0.7–3.1)
Lymphs: 17 %
MCH: 27.5 pg (ref 26.6–33.0)
MCHC: 32.2 g/dL (ref 31.5–35.7)
MCV: 85 fL (ref 79–97)
Monocytes Absolute: 0.5 10*3/uL (ref 0.1–0.9)
Monocytes: 4 %
Neutrophils Absolute: 8.4 10*3/uL — ABNORMAL HIGH (ref 1.4–7.0)
Neutrophils: 78 %
Platelets: 323 10*3/uL (ref 150–450)
RBC: 4.11 x10E6/uL (ref 3.77–5.28)
RDW: 13.3 % (ref 11.7–15.4)
RPR Ser Ql: NONREACTIVE
Rh Factor: POSITIVE
Rubella Antibodies, IGG: 8.3 index (ref >0.99)
Varicella zoster IgG: 1468 index (ref >165)
WBC: 10.9 10*3/uL — ABNORMAL HIGH (ref 3.4–10.8)

## 2022-02-21 LAB — HCV INTERPRETATION

## 2022-02-28 ENCOUNTER — Other Ambulatory Visit (HOSPITAL_COMMUNITY)
Admission: RE | Admit: 2022-02-28 | Discharge: 2022-02-28 | Disposition: A | Discharge status: Home / Self Care | Payer: BC Managed Care – PPO | Source: Ambulatory Visit | Attending: Obstetrics | Admitting: Obstetrics

## 2022-02-28 ENCOUNTER — Ambulatory Visit (INDEPENDENT_AMBULATORY_CARE_PROVIDER_SITE_OTHER): Payer: BC Managed Care – PPO | Admitting: Obstetrics

## 2022-02-28 VITALS — BP 128/88 | Wt 127.0 lb

## 2022-02-28 DIAGNOSIS — O0991 Supervision of high risk pregnancy, unspecified, first trimester: Secondary | ICD-10-CM | POA: Diagnosis not present

## 2022-02-28 DIAGNOSIS — Z3480 Encounter for supervision of other normal pregnancy, unspecified trimester: Secondary | ICD-10-CM

## 2022-02-28 DIAGNOSIS — Z113 Encounter for screening for infections with a predominantly sexual mode of transmission: Secondary | ICD-10-CM | POA: Diagnosis not present

## 2022-02-28 DIAGNOSIS — Z124 Encounter for screening for malignant neoplasm of cervix: Secondary | ICD-10-CM | POA: Insufficient documentation

## 2022-02-28 DIAGNOSIS — Z3A11 11 weeks gestation of pregnancy: Secondary | ICD-10-CM

## 2022-02-28 NOTE — Progress Notes (Signed)
NOB today. Pt has had a lot of nausea.

## 2022-02-28 NOTE — Progress Notes (Signed)
New Obstetric Patient H&P    Chief Complaint: "Desires prenatal care"   History of Present Illness: Patient is a 31 y.o. [id number] Not Hispanic or Latino female, LMP 12/13/2021 presents with amenorrhea and positive home pregnancy test. Based on her  LMP, her EDD is Estimated Date of Delivery: 09/19/22 and her EGA is [redacted]w[redacted]d. Cycles are 5. days, regular, and occur approximately every : 28 days. Her last pap smear was about 1 years ago and was ASCUS with HIgh Risk HPV.    She had a urine pregnancy test which was positive about 3 week(s)  ago. Her last menstrual period was normal and lasted for  about 5 day(s). Since her LMP she claims she has experienced fatigue and occasional dizziness. She denies vaginal bleeding. Her past medical history is noncontributory. Her prior pregnancies are notable for one vaginal birth in 2019, then one SAB in 2022, and a 16 week loss in April of this year.  Since her LMP, she admits to the use of tobacco products  no She claims she has gained   no pounds since the start of her pregnancy.  There are cats in the home in the home  no  She admits close contact with children on a regular basis  yes  She has had chicken pox in the past no She has had Tuberculosis exposures, symptoms, or previously tested positive for TB   no Current or past history of domestic violence. no  Genetic Screening/Teratology Counseling: (Includes patient, baby's father, or anyone in either family with:)   1. Patient's age >/= 35 at [facility]  no 2. Thalassemia ([place], [place], Mediterranean, or Asian background): MCV<80  no 3. Neural tube defect (meningomyelocele, spina bifida, anencephaly)  no 4. Congenital heart defect  no  5. Down syndrome  no 6. Tay-Sachs (Jewish, [place]  no 7. Canavan's Disease  no 8. Sickle cell disease or trait (African)  no  9. Hemophilia or other blood disorders  no  10. Muscular dystrophy  no  11. Cystic fibrosis  no  12. Huntington's Chorea  no   13. Mental retardation/autism  no 14. Other inherited genetic or chromosomal disorder  no 15. Maternal metabolic disorder (DM, PKU, etc)  no 16. Patient or FOB with a child with a birth defect not listed above no  16a. Patient or FOB with a birth defect themselves no 17. Recurrent pregnancy loss, or stillbirth  yes  18. Any medications since LMP other than prenatal vitamins (include vitamins, supplements, OTC meds, drugs, alcohol)  no 19. Any other genetic/environmental exposure to discuss  no  Infection History:   1. Lives with someone with TB or TB exposed  no  2. Patient or partner has history of genital herpes  no 3. Rash or viral illness since LMP  no 4. History of STI (GC, CT, HPV, syphilis, HIV)  no 5. History of recent travel :  no  Other pertinent information:  She had a 16 week loss this April, and a SAB last year in 2022.     Review of Systems:10 point review of systems negative unless otherwise noted in HPI  Past Medical History:  Past Medical History:  Diagnosis Date  . 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 tonselectomy     age 18    Gynecologic History: Patient's last menstrual period was 12/13/2021 (exact date).  Obstetric History:  [id number]  Family History:  Family History  Problem Relation Age of Onset  . Diabetes Mother   . Heart attack Mother   . Hypertension Maternal Grandmother   . Heart attack Maternal Grandmother        2009  . Cancer Maternal Grandfather 54       pancreatic    Social History:  Social History   Socioeconomic History  . Marital status: Single    Spouse name: Not on file  . Number of children: 1  . Years of education: 4  . Highest education level: Not on file  Occupational History  . Occupation: Fraud and Investigation analyist  Tobacco Use  . Smoking status: Never  . Smokeless tobacco: Never  Vaping Use  . Vaping Use: Never used   Substance and Sexual Activity  . Alcohol use: Not Currently    Comment: social  . Drug use: No  . Sexual activity: Yes    Partners: Male    Birth control/protection: None  Other Topics Concern  . Not on file  Social History Narrative  . Not on file   Social Determinants of Health   Financial Resource Strain: Low Risk  (01/31/2022)   Overall Financial Resource Strain (CARDIA)   . Difficulty of Paying Living Expenses: Not hard at all  Food Insecurity: No Food Insecurity (01/31/2022)   Hunger Vital Sign   . Worried About Programme researcher, broadcasting/film/video in the Last Year: Never true   . Ran Out of Food in the Last Year: Never true  Transportation Needs: No Transportation Needs (01/31/2022)   PRAPARE - Transportation   . Lack of Transportation (Medical): No   . Lack of Transportation (Non-Medical): No  Physical Activity: Insufficiently Active (01/31/2022)   Exercise Vital Sign   . Days of Exercise per Week: 3 days   . Minutes of Exercise per Session: 20 min  Stress: No Stress Concern Present (01/31/2022)   [employer] of Occupational Health - Occupational Stress Questionnaire   . Feeling of Stress : Not at all  Social Connections: Moderately Integrated (01/31/2022)   Social Connection and Isolation Panel [NHANES]   . Frequency of Communication with Friends and Family: More than three times a week   . Frequency of Social Gatherings with Friends and Family: Three times a week   . Attends Religious Services: More than 4 times per year   . Active Member of Clubs or Organizations: No   . Attends Banker Meetings: Never   . Marital Status: Living with partner  Intimate Partner Violence: Not At Risk (01/31/2022)   Humiliation, Afraid, Rape, and Kick questionnaire   . Fear of Current or Ex-Partner: No   . Emotionally Abused: No   . Physically Abused: No   . Sexually Abused: No    Allergies:  No Known Allergies  Medications: Prior to Admission medications   Medication Sig  Start Date End Date Taking? Authorizing Provider  ferrous sulfate 325 (65 FE) MG tablet Take 325 mg by mouth daily with breakfast.    [provider]  Prenatal Vit-Fe Fumarate-FA (PRENATAL 19) 29-1 MG CHEW Chew by mouth.    [provider]    Physical Exam Vitals: Blood pressure 128/88, weight 127 lb (57.6 kg), last menstrual period 12/13/2021.  General: NAD HEENT: normocephalic, anicteric Thyroid: no enlargement, no palpable nodules Pulmonary: No increased work of breathing, CTAB Cardiovascular: RRR, distal pulses 2+ Abdomen: NABS, soft, non-tender, non-distended.  Umbilicus without lesions.  No hepatomegaly, splenomegaly or  masses palpable. No evidence of hernia  Genitourinary:  External: Normal external female genitalia.  Normal urethral meatus, normal  Bartholin's and Skene's glands.    Vagina: Normal vaginal mucosa, no evidence of prolapse.    Cervix: Grossly normal in appearance, no bleeding  Uterus: anteverted -enlarged, mobile, normal contour.  No CMT  Adnexa: ovaries non-enlarged, no adnexal masses  Rectal: deferred Extremities: no edema, erythema, or tenderness Neurologic: Grossly intact Psychiatric: mood appropriate, affect full   Assessment: 31 y.o. [id number] at [redacted]w[redacted]d presenting to initiate prenatal care  Plan: 1) Avoid alcoholic beverages. 2) Patient encouraged not to smoke.  3) Discontinue the use of all non-medicinal drugs and chemicals.  4) Take prenatal vitamins daily.  5) Nutrition, food safety (fish, cheese advisories, and high nitrite foods) and exercise discussed. 6) Hospital and practice style discussed with cross coverage system.  7) Genetic Screening, such as with 1st Trimester Screening, cell free fetal DNA, AFP testing, and Ultrasound, as well as with amniocentesis and CVS as appropriate, is discussed with patient. At the conclusion of today's visit patient undecided genetic testing 8) Patient is asked about travel to areas at risk for the  [place] virus, and counseled to avoid travel and exposure to mosquitoes or sexual partners who may have themselves been exposed to the virus. Testing is discussed, and will be ordered as appropriate.    I have ordered a dating scan for her. Have also referred her to MFM for evaluation of possible cervical insufficiency. I did discuss the close spacing of these last pregnancies. She was counseled about this after her loss in April of this year.  RTC in 2 weeks with MD if possible.  Imagene Riches, CNM  02/28/2022 1:01 PM

## 2022-03-04 LAB — CERVICOVAGINAL ANCILLARY ONLY
Bacterial Vaginitis (gardnerella): NEGATIVE
Chlamydia: NEGATIVE
Comment: NEGATIVE
Comment: NEGATIVE
Comment: NEGATIVE
Comment: NORMAL
Neisseria Gonorrhea: NEGATIVE
Trichomonas: NEGATIVE

## 2022-03-05 DIAGNOSIS — Z3A24 24 weeks gestation of pregnancy: Secondary | ICD-10-CM | POA: Diagnosis not present

## 2022-03-05 DIAGNOSIS — Z3482 Encounter for supervision of other normal pregnancy, second trimester: Secondary | ICD-10-CM | POA: Diagnosis not present

## 2022-03-06 LAB — CYTOLOGY - PAP
Comment: NEGATIVE
Diagnosis: NEGATIVE
High risk HPV: NEGATIVE

## 2022-03-11 ENCOUNTER — Other Ambulatory Visit: Payer: Self-pay

## 2022-03-11 DIAGNOSIS — Z8759 Personal history of other complications of pregnancy, childbirth and the puerperium: Secondary | ICD-10-CM

## 2022-03-11 DIAGNOSIS — O0991 Supervision of high risk pregnancy, unspecified, first trimester: Secondary | ICD-10-CM

## 2022-03-11 NOTE — Progress Notes (Signed)
U/S order required per MFM

## 2022-03-19 ENCOUNTER — Ambulatory Visit (HOSPITAL_BASED_OUTPATIENT_CLINIC_OR_DEPARTMENT_OTHER): Payer: BC Managed Care – PPO | Admitting: Maternal & Fetal Medicine

## 2022-03-19 ENCOUNTER — Ambulatory Visit: Payer: BC Managed Care – PPO | Attending: Obstetrics and Gynecology

## 2022-03-19 ENCOUNTER — Ambulatory Visit: Payer: BC Managed Care – PPO | Admitting: *Deleted

## 2022-03-19 VITALS — BP 118/67 | HR 83

## 2022-03-19 DIAGNOSIS — Z3A16 16 weeks gestation of pregnancy: Secondary | ICD-10-CM | POA: Insufficient documentation

## 2022-03-19 DIAGNOSIS — Z3A13 13 weeks gestation of pregnancy: Secondary | ICD-10-CM | POA: Insufficient documentation

## 2022-03-19 DIAGNOSIS — W19XXXA Unspecified fall, initial encounter: Secondary | ICD-10-CM | POA: Insufficient documentation

## 2022-03-19 DIAGNOSIS — O3432 Maternal care for cervical incompetence, second trimester: Secondary | ICD-10-CM | POA: Diagnosis not present

## 2022-03-19 DIAGNOSIS — O0991 Supervision of high risk pregnancy, unspecified, first trimester: Secondary | ICD-10-CM

## 2022-03-19 DIAGNOSIS — O3430 Maternal care for cervical incompetence, unspecified trimester: Secondary | ICD-10-CM | POA: Diagnosis not present

## 2022-03-19 DIAGNOSIS — O09292 Supervision of pregnancy with other poor reproductive or obstetric history, second trimester: Secondary | ICD-10-CM | POA: Insufficient documentation

## 2022-03-19 DIAGNOSIS — N883 Incompetence of cervix uteri: Secondary | ICD-10-CM | POA: Diagnosis not present

## 2022-03-19 DIAGNOSIS — O3431 Maternal care for cervical incompetence, first trimester: Secondary | ICD-10-CM | POA: Insufficient documentation

## 2022-03-19 DIAGNOSIS — Z369 Encounter for antenatal screening, unspecified: Secondary | ICD-10-CM | POA: Diagnosis not present

## 2022-03-19 DIAGNOSIS — Z3401 Encounter for supervision of normal first pregnancy, first trimester: Secondary | ICD-10-CM | POA: Diagnosis not present

## 2022-03-19 DIAGNOSIS — Z8759 Personal history of other complications of pregnancy, childbirth and the puerperium: Secondary | ICD-10-CM

## 2022-03-19 NOTE — Progress Notes (Signed)
MFM Consult Note Patient Name: ALIVIYA BASTEN  Patient MRN:   [id number]  Referring provider: [facility] Reason for Consult: History of cervical insufficiency  HPI: BAISLEY GASSNER is a 31 y.o. G4P1021 at [redacted]w[redacted]d by LMP confirmed with early ultrasound.  The patient is here for a consultation for history of a 16-week 6-day loss.  She reports she had 1 term vaginal delivery without complications followed by a miscarriage at 8 weeks. There was no need for instrumentation during or after her miscarriage.  The only procedure she had in her cervix was what sounds like a LEEP procedure after her 16-week loss.  During the specific pregnancy where she had the loss at 16w she reports that she had no symptoms at all until she noticed a bulge when she was going to the bathroom.  She fell down and noticed the bag of water.  She said she she went into the hospital and shortly delivered afterwards.  She denies vaginal bleeding or contractions prior to this event.  She has researched cerclage and is most interested in this option.  We discussed aneuploidy screening and she is not interested.  She discussed this would not change her decision to continue the pregnancy or have a cerclage.  Review of Systems: A review of systems was performed and was negative except per HPI   Past Obstetrical History:  OB History  Gravida Para Term Preterm AB Living  4 1 1  0 2 1  SAB IAB Ectopic Multiple Live Births  1 0 0 0 1    # Outcome Date GA Lbr Len/2nd Weight Sex Delivery Anes PTL Lv  4 Current           3 AB 09/16/21 [redacted]w[redacted]d            Birth Comments: died a few minutes after birth  2 SAB 04/2021 106w0d         1 Term 11/17/17 [redacted]w[redacted]d 09:01 / 01:09 6 lb 11.6 oz (3.05 kg) M Vag-Spont EPI  LIV     Birth Comments: Temp=100.4    Past Gynecologic History:  She reports a history of an abnormal pap requiring either a LEEP or bx.  Past Medical History:  Past Medical History:  Diagnosis Date   Anemia    LGSIL on Pap smear of  cervix 02/2020    Past Surgical History:   Past Surgical History:  Procedure Laterality Date   tonselectomy     age 71   Family History:   family history includes Asthma in her maternal grandmother, mother, and son; Cancer (age of onset: 5) in her maternal grandfather; Diabetes in her mother; Heart attack in her maternal grandmother and mother; Heart disease in her maternal grandmother and mother; Hypertension in her maternal grandmother.  Social History:   Social History   Socioeconomic History   Marital status: Single    Spouse name: Not on file   Number of children: 1   Years of education: 16   Highest education level: Not on file  Occupational History   Occupation: Fraud and Investigation analyist  Tobacco Use   Smoking status: Never   Smokeless tobacco: Never  Vaping Use   Vaping Use: Never used  Substance and Sexual Activity   Alcohol use: Not Currently    Comment: social   Drug use: No   Sexual activity: Yes    Partners: Male    Birth control/protection: None  Other Topics Concern   Not on file  Social History Narrative  Not on file   Social Determinants of Health   Financial Resource Strain: Low Risk  (01/31/2022)   Overall Financial Resource Strain (CARDIA)    Difficulty of Paying Living Expenses: Not hard at all  Food Insecurity: No Food Insecurity (01/31/2022)   Hunger Vital Sign    Worried About Running Out of Food in the Last Year: Never true    Ran Out of Food in the Last Year: Never true  Transportation Needs: No Transportation Needs (01/31/2022)   PRAPARE - Hydrologist (Medical): No    Lack of Transportation (Non-Medical): No  Physical Activity: Insufficiently Active (01/31/2022)   Exercise Vital Sign    Days of Exercise per Week: 3 days    Minutes of Exercise per Session: 20 min  Stress: No Stress Concern Present (01/31/2022)   [place]    Feeling of  Stress : Not at all  Social Connections: Moderately Integrated (01/31/2022)   Social Connection and Isolation Panel [NHANES]    Frequency of Communication with Friends and Family: More than three times a week    Frequency of Social Gatherings with Friends and Family: Three times a week    Attends Religious Services: More than 4 times per year    Active Member of Clubs or Organizations: No    Attends Archivist Meetings: Never    Marital Status: Living with partner  Intimate Partner Violence: Not At Risk (01/31/2022)   Humiliation, Afraid, Rape, and Kick questionnaire    Fear of Current or Ex-Partner: No    Emotionally Abused: No    Physically Abused: No    Sexually Abused: No      Home Medications:   Current Outpatient Medications on File Prior to Visit  Medication Sig Dispense Refill   ferrous sulfate 325 (65 FE) MG tablet Take 325 mg by mouth daily with breakfast.     Prenatal Vit-Fe Fumarate-FA (PRENATAL 19) 29-1 MG CHEW Chew by mouth. (Patient not taking: Reported on 03/19/2022)     No current facility-administered medications on file prior to visit.    Allergies:   No Known Allergies   Physical Exam:   See intake sheet for vitals Sitting comfortably on the sonogram table Nonlabored breathing Normal rate and rhythm  Assessment  Pansie Yett is a 31 y.o. [id number] at [redacted]w[redacted]d by LMP consistent with early [place] here for consultation  1.  History of cervical insufficiency - Based on her history, I recommend history indicated cerclage.  I also discussed the alternatives of transvaginal cervical length screening and expectant management.  The patient declined these options and strongly desires a cervical cerclage. I discussed this with Dr. Amalia Hailey who has agreed to schedule the patient for a consultation for cerclage as well as a preoperative visit. I discussed the risks and benefits and alternatives to a cervical cerclage.  The patient is very interested in the procedure and  would like to proceed forward with this. -I discussed the role of aneuploidy screening and the patient declined at this time.  The first trimester ultrasound appears grossly normal but is limited due to gestational age. -I have also ordered the patient's anatomic survey to be done around 18 to 70 weeks.  If the referring provider would like to do this at their office this can be canceled.  Thank you for the opportunity to be involved with this patient's care. Please let us know if we can be of any  further assistance.   Valeda Malm  MFM, [place]   I spent 45 minutes with the patient discussing her medical problems and in chart review.   03/19/2022  2:52 PM

## 2022-03-20 ENCOUNTER — Other Ambulatory Visit: Payer: Self-pay | Admitting: *Deleted

## 2022-03-20 DIAGNOSIS — O3432 Maternal care for cervical incompetence, second trimester: Secondary | ICD-10-CM

## 2022-03-21 ENCOUNTER — Ambulatory Visit (INDEPENDENT_AMBULATORY_CARE_PROVIDER_SITE_OTHER): Payer: BC Managed Care – PPO | Admitting: Obstetrics and Gynecology

## 2022-03-21 ENCOUNTER — Encounter: Payer: Self-pay | Admitting: Obstetrics and Gynecology

## 2022-03-21 VITALS — BP 113/65 | HR 76 | Wt 130.2 lb

## 2022-03-21 DIAGNOSIS — Z8759 Personal history of other complications of pregnancy, childbirth and the puerperium: Secondary | ICD-10-CM

## 2022-03-21 DIAGNOSIS — O09292 Supervision of pregnancy with other poor reproductive or obstetric history, second trimester: Secondary | ICD-10-CM

## 2022-03-21 DIAGNOSIS — Z01818 Encounter for other preprocedural examination: Secondary | ICD-10-CM

## 2022-03-21 DIAGNOSIS — O0990 Supervision of high risk pregnancy, unspecified, unspecified trimester: Secondary | ICD-10-CM

## 2022-03-21 DIAGNOSIS — Z3A14 14 weeks gestation of pregnancy: Secondary | ICD-10-CM

## 2022-03-21 NOTE — Progress Notes (Signed)
ROB Consult: Michelle Miller is a 31 y.o. [phone] female at [redacted]w[redacted]d with prior history of fetal loss at 16-[redacted] weeks gestation earlier this year. Per patient, she had overall been doing well until one day noted some mild pelvic and hip discomfort, pelvic pressure, and several hours later felt a bulge in her pelvis and noted it was her amniotic sac with bleeding. Was seen by MFM last week, who recommended cerclage placement due to history of suspected cervical incompetence, as patient with history of essentially painless dilation (patient denies ever feeling true contractions). Had ultrasound last week which did note normal cervical length, closed cervix.   Discussed risks/benefits of surgical intervention with cervical cerclage.  Reviewed post-procedure instructions. Also discussed possibility of use of progesterone supplementation. Will attempt to schedule in the next week.  Advised that she should hear from surgical scheduler in the early part of next week.

## 2022-03-21 NOTE — Progress Notes (Signed)
Entered in error

## 2022-03-21 NOTE — Progress Notes (Signed)
Patient is a 31 y.o. G80P1021 female who presents for consult for cerclage.

## 2022-03-26 ENCOUNTER — Encounter
Admission: RE | Admit: 2022-03-26 | Discharge: 2022-03-26 | Disposition: A | Discharge status: Home / Self Care | Payer: BC Managed Care – PPO | Source: Ambulatory Visit | Attending: Obstetrics and Gynecology | Admitting: Obstetrics and Gynecology

## 2022-03-26 HISTORY — DX: Incompetence of cervix uteri: N88.3

## 2022-03-26 HISTORY — DX: Personal history of other complications of pregnancy, childbirth and the puerperium: Z87.59

## 2022-03-26 NOTE — Patient Instructions (Signed)
Your procedure is scheduled on:03-28-22 Friday Report to the Registration Desk on the 1st floor of the [facility].Then proceed to the 2nd floor Surgery Desk  To find out your arrival time, please call [phone] between 1PM - 3PM on:03-27-22 Thursday If your arrival time is 6:00 am, do not arrive prior to that time as the [place] entrance doors do not open until 6:00 am.  REMEMBER: Instructions that are not followed completely may result in serious medical risk, up to and including death; or upon the discretion of your surgeon and anesthesiologist your surgery may need to be rescheduled.  Do not eat food OR drink any liquids after midnight the night before surgery.  No gum chewing, lozengers or hard candies.  Do NOT take any medication the day of surgery  One week prior to surgery: Stop Anti-inflammatories (NSAIDS) such as Advil, Aleve, Ibuprofen, Motrin, Naproxen, Naprosyn and Aspirin based products such as Excedrin, Goodys Powder, BC Powder. You may however, take Tylenol if needed for pain up until the day of surgery.  No Alcohol for 24 hours before or after surgery.  No Smoking including e-cigarettes for 24 hours prior to surgery.  No chewable tobacco products for at least 6 hours prior to surgery.  No nicotine patches on the day of surgery.  Do not use any "recreational" drugs for at least a week prior to your surgery.  Please be advised that the combination of cocaine and anesthesia may have negative outcomes, up to and including death. If you test positive for cocaine, your surgery will be cancelled.  On the morning of surgery brush your teeth with toothpaste and water, you may rinse your mouth with mouthwash if you wish. Do not swallow any toothpaste or mouthwash.  Do not wear jewelry, make-up, hairpins, clips or nail polish.  Do not wear lotions, powders, or perfumes.   Do not shave body from the neck down 48 hours prior to surgery just in case you cut yourself  which could leave a site for infection.  Also, freshly shaved skin may become irritated if using the CHG soap.  Contact lenses, hearing aids and dentures may not be worn into surgery.  Do not bring valuables to the hospital. [facility] is not responsible for any missing/lost belongings or valuables.   Notify your doctor if there is any change in your medical condition (cold, fever, infection).  Wear comfortable clothing (specific to your surgery type) to the hospital.  After surgery, you can help prevent lung complications by doing breathing exercises.  Take deep breaths and cough every 1-2 hours. Your doctor may order a device called an Incentive Spirometer to help you take deep breaths. When coughing or sneezing, hold a pillow firmly against your incision with both hands. This is called "splinting." Doing this helps protect your incision. It also decreases belly discomfort.  If you are being admitted to the hospital overnight, leave your suitcase in the car. After surgery it may be brought to your room.  If you are being discharged the day of surgery, you will not be allowed to drive home. You will need a responsible adult (18 years or older) to drive you home and stay with you that night.   If you are taking public transportation, you will need to have a responsible adult (18 years or older) with you. Please confirm with your physician that it is acceptable to use public transportation.   Please call the [facility] at [phone] if you  have any questions about these instructions.  Surgery Visitation Policy:  Patients undergoing a surgery or procedure may have two family members or support persons with them as long as the person is not COVID-19 positive or experiencing its symptoms.

## 2022-03-27 ENCOUNTER — Encounter
Admission: RE | Admit: 2022-03-27 | Discharge: 2022-03-27 | Disposition: A | Discharge status: Home / Self Care | Payer: BC Managed Care – PPO | Source: Ambulatory Visit | Attending: Obstetrics and Gynecology | Admitting: Obstetrics and Gynecology

## 2022-03-27 DIAGNOSIS — Z01818 Encounter for other preprocedural examination: Secondary | ICD-10-CM

## 2022-03-27 DIAGNOSIS — Z01812 Encounter for preprocedural laboratory examination: Secondary | ICD-10-CM | POA: Insufficient documentation

## 2022-03-27 DIAGNOSIS — D649 Anemia, unspecified: Secondary | ICD-10-CM | POA: Diagnosis not present

## 2022-03-27 DIAGNOSIS — O3432 Maternal care for cervical incompetence, second trimester: Secondary | ICD-10-CM | POA: Diagnosis not present

## 2022-03-27 DIAGNOSIS — O99012 Anemia complicating pregnancy, second trimester: Secondary | ICD-10-CM | POA: Diagnosis not present

## 2022-03-27 DIAGNOSIS — Z3A15 15 weeks gestation of pregnancy: Secondary | ICD-10-CM | POA: Diagnosis not present

## 2022-03-27 LAB — CBC
HCT: 32.3 % — ABNORMAL LOW (ref 36.0–46.0)
Hemoglobin: 10.7 g/dL — ABNORMAL LOW (ref 12.0–15.0)
MCH: 28.6 pg (ref 26.0–34.0)
MCHC: 33.1 g/dL (ref 30.0–36.0)
MCV: 86.4 fL (ref 80.0–100.0)
Platelets: 303 10*3/uL (ref 150–400)
RBC: 3.74 MIL/uL — ABNORMAL LOW (ref 3.87–5.11)
RDW: 13.3 % (ref 11.5–15.5)
WBC: 10 10*3/uL (ref 4.0–10.5)
nRBC: 0 % (ref 0.0–0.2)

## 2022-03-28 ENCOUNTER — Ambulatory Visit
Admission: RE | Admit: 2022-03-28 | Discharge: 2022-03-28 | Disposition: A | Discharge status: Home / Self Care | Payer: BC Managed Care – PPO | Source: Ambulatory Visit | Attending: Obstetrics and Gynecology | Admitting: Obstetrics and Gynecology

## 2022-03-28 ENCOUNTER — Ambulatory Visit: Payer: BC Managed Care – PPO | Admitting: Certified Registered"

## 2022-03-28 ENCOUNTER — Ambulatory Visit: Payer: BC Managed Care – PPO | Admitting: Urgent Care

## 2022-03-28 ENCOUNTER — Encounter: Payer: Self-pay | Admitting: Obstetrics and Gynecology

## 2022-03-28 ENCOUNTER — Other Ambulatory Visit: Payer: Self-pay

## 2022-03-28 ENCOUNTER — Encounter: Payer: BC Managed Care – PPO | Admitting: Licensed Practical Nurse

## 2022-03-28 ENCOUNTER — Encounter
Admission: RE | Disposition: A | Discharge status: Home / Self Care | Payer: Self-pay | Source: Ambulatory Visit | Attending: Obstetrics and Gynecology

## 2022-03-28 DIAGNOSIS — O09292 Supervision of pregnancy with other poor reproductive or obstetric history, second trimester: Secondary | ICD-10-CM | POA: Diagnosis not present

## 2022-03-28 DIAGNOSIS — O99012 Anemia complicating pregnancy, second trimester: Secondary | ICD-10-CM | POA: Diagnosis not present

## 2022-03-28 DIAGNOSIS — N883 Incompetence of cervix uteri: Secondary | ICD-10-CM

## 2022-03-28 DIAGNOSIS — O3432 Maternal care for cervical incompetence, second trimester: Secondary | ICD-10-CM | POA: Diagnosis not present

## 2022-03-28 DIAGNOSIS — D649 Anemia, unspecified: Secondary | ICD-10-CM | POA: Diagnosis not present

## 2022-03-28 DIAGNOSIS — Z3A15 15 weeks gestation of pregnancy: Secondary | ICD-10-CM | POA: Diagnosis not present

## 2022-03-28 HISTORY — PX: CERVICAL CERCLAGE: SHX1329

## 2022-03-28 SURGERY — CERCLAGE, CERVIX, VAGINAL APPROACH
Anesthesia: Spinal | Site: Vagina

## 2022-03-28 MED ORDER — CHLORHEXIDINE GLUCONATE 0.12 % MT SOLN: 15.0000 mL | Freq: Once | OROMUCOSAL | Status: AC

## 2022-03-28 MED ORDER — PROPOFOL 1000 MG/100ML IV EMUL
INTRAVENOUS | Status: AC
  Filled 2022-03-28: qty 100

## 2022-03-28 MED ORDER — ACETAMINOPHEN 10 MG/ML IV SOLN
INTRAVENOUS | Status: AC
  Filled 2022-03-28: qty 100

## 2022-03-28 MED ORDER — ACETAMINOPHEN 500 MG PO TABS: 1000.0000 mg | ORAL_TABLET | Freq: Four times a day (QID) | ORAL | 1 refills | Status: DC | PRN

## 2022-03-28 MED ORDER — CHLORHEXIDINE GLUCONATE 0.12 % MT SOLN
OROMUCOSAL | Status: AC
  Administered 2022-03-28: 15 mL via OROMUCOSAL
  Filled 2022-03-28: qty 15

## 2022-03-28 MED ORDER — ONDANSETRON HCL 4 MG/2ML IJ SOLN
INTRAMUSCULAR | Status: AC
  Administered 2022-03-28: 4 mg via INTRAVENOUS
  Filled 2022-03-28: qty 2

## 2022-03-28 MED ORDER — MIDAZOLAM HCL 2 MG/2ML IJ SOLN
INTRAMUSCULAR | Status: AC
  Filled 2022-03-28: qty 2

## 2022-03-28 MED ORDER — FAMOTIDINE 20 MG PO TABS: 20.0000 mg | ORAL_TABLET | Freq: Once | ORAL | Status: AC

## 2022-03-28 MED ORDER — 0.9 % SODIUM CHLORIDE (POUR BTL) OPTIME
TOPICAL | Status: DC | PRN
  Administered 2022-03-28: 500 mL

## 2022-03-28 MED ORDER — ACETAMINOPHEN 500 MG PO TABS: 1000.0000 mg | ORAL_TABLET | ORAL | Status: AC

## 2022-03-28 MED ORDER — ONDANSETRON HCL 4 MG/2ML IJ SOLN: 4.0000 mg | Freq: Once | INTRAMUSCULAR | Status: AC

## 2022-03-28 MED ORDER — LACTATED RINGERS IV SOLN: INTRAVENOUS | Status: DC

## 2022-03-28 MED ORDER — BUPIVACAINE IN DEXTROSE 0.75-8.25 % IT SOLN
INTRATHECAL | Status: DC | PRN
  Administered 2022-03-28: 1.2 mL via INTRATHECAL

## 2022-03-28 MED ORDER — FENTANYL CITRATE (PF) 100 MCG/2ML IJ SOLN
INTRAMUSCULAR | Status: AC
  Filled 2022-03-28: qty 2

## 2022-03-28 MED ORDER — ACETAMINOPHEN 500 MG PO TABS
ORAL_TABLET | ORAL | Status: AC
  Administered 2022-03-28: 1000 mg via ORAL
  Filled 2022-03-28: qty 1

## 2022-03-28 MED ORDER — NIFEDIPINE 10 MG PO CAPS: 10.0000 mg | ORAL_CAPSULE | Freq: Three times a day (TID) | ORAL | 0 refills | Status: DC

## 2022-03-28 MED ORDER — FAMOTIDINE 20 MG PO TABS
ORAL_TABLET | ORAL | Status: AC
  Administered 2022-03-28: 20 mg via ORAL
  Filled 2022-03-28: qty 1

## 2022-03-28 MED ORDER — ORAL CARE MOUTH RINSE: 15.0000 mL | Freq: Once | OROMUCOSAL | Status: AC

## 2022-03-28 SURGICAL SUPPLY — 20 items
CATH ROBINSON RED A/P 16FR (CATHETERS) ×1 IMPLANT
DRAPE PERI LITHO V/GYN (MISCELLANEOUS) ×1 IMPLANT
DRAPE UNDER BUTTOCK W/FLU (DRAPES) ×1 IMPLANT
ELECT REM PT RETURN 9FT ADLT (ELECTROSURGICAL) ×1
ELECTRODE REM PT RTRN 9FT ADLT (ELECTROSURGICAL) ×1 IMPLANT
GAUZE 4X4 16PLY ~~LOC~~+RFID DBL (SPONGE) ×1 IMPLANT
GLOVE BIO SURGEON STRL SZ 6.5 (GLOVE) ×1 IMPLANT
GLOVE SURG UNDER LTX SZ7 (GLOVE) ×1 IMPLANT
GOWN STRL REUS W/ TWL LRG LVL3 (GOWN DISPOSABLE) ×1 IMPLANT
GOWN STRL REUS W/TWL LRG LVL3 (GOWN DISPOSABLE) ×1
KIT TURNOVER CYSTO (KITS) ×1 IMPLANT
LABEL OR SOLS (LABEL) ×1 IMPLANT
MANIFOLD NEPTUNE II (INSTRUMENTS) ×1 IMPLANT
PACK BASIN MINOR ARMC (MISCELLANEOUS) ×1 IMPLANT
PAD OB MATERNITY 4.3X12.25 (PERSONAL CARE ITEMS) ×1 IMPLANT
PAD PREP 24X41 OB/GYN DISP (PERSONAL CARE ITEMS) ×1 IMPLANT
SCRUB CHG 4% DYNA-HEX 4OZ (MISCELLANEOUS) ×1 IMPLANT
SUT ETHIBOND NAB CT1 #1 30IN (SUTURE) ×2 IMPLANT
TRAP FLUID SMOKE EVACUATOR (MISCELLANEOUS) ×1 IMPLANT
WATER STERILE IRR 500ML POUR (IV SOLUTION) ×1 IMPLANT

## 2022-03-28 NOTE — Anesthesia Preprocedure Evaluation (Signed)
Anesthesia Evaluation  Patient identified by MRN, date of birth, ID band Patient awake    Reviewed: Allergy & Precautions, H&P , NPO status , Patient's Chart, lab work & pertinent test results, reviewed documented beta blocker date and time   History of Anesthesia Complications Negative for: history of anesthetic complications  Airway Mallampati: III  TM Distance: >3 FB Neck ROM: full    Dental  (+) Dental Advidsory Given, Chipped, Teeth Intact   Pulmonary neg pulmonary ROS,    Pulmonary exam normal breath sounds clear to auscultation       Cardiovascular Exercise Tolerance: Good negative cardio ROS Normal cardiovascular exam Rhythm:regular Rate:Normal     Neuro/Psych negative neurological ROS  negative psych ROS   GI/Hepatic negative GI ROS, Neg liver ROS,   Endo/Other  negative endocrine ROS  Renal/GU negative Renal ROS  negative genitourinary   Musculoskeletal   Abdominal   Peds  Hematology  (+) Blood dyscrasia, anemia ,   Anesthesia Other Findings Past Medical History: No date: Anemia No date: Cervical incompetence No date: History of perinatal fetal loss 02/2020: LGSIL on Pap smear of cervix   Reproductive/Obstetrics (+) Pregnancy                             Anesthesia Physical Anesthesia Plan  ASA: 2  Anesthesia Plan: Spinal   Post-op Pain Management:    Induction:   PONV Risk Score and Plan:   Airway Management Planned: Natural Airway  Additional Equipment:   Intra-op Plan:   Post-operative Plan:   Informed Consent: I have reviewed the patients History and Physical, chart, labs and discussed the procedure including the risks, benefits and alternatives for the proposed anesthesia with the patient or authorized representative who has indicated his/her understanding and acceptance.     Dental Advisory Given  Plan Discussed with: Anesthesiologist, CRNA and  Surgeon  Anesthesia Plan Comments:         Anesthesia Quick Evaluation

## 2022-03-28 NOTE — Op Note (Addendum)
Procedure(s): CERCLAGE CERVICAL ([facility]) Procedure Note  Michelle Miller female 31 y.o. 03/28/2022  Indications: The patient is a 31 y.o. G54P1021 female with history of second trimester loss (17 weeks), cervical incompetency.  Pre-operative Diagnosis: Cervical incompetency, history of second trimester loss.   Post-operative Diagnosis:   Surgeon: Rubie Maid, MD  Assistants:  Surgical scrub tech.   Anesthesia: Spinal anesthesia  Findings: The cervix appeared multiparous, possibly 0.5 cm external os dilation on visualization  Procedure Details: The patient was seen in the Holding Room. The risks, benefits, complications, treatment options, and expected outcomes were discussed with the patient.  The patient concurred with the proposed plan, giving informed consent.  The site of surgery properly noted/marked. The patient was taken to the Operating Room, identified as Michelle Miller and the procedure verified as Procedure(s) (LRB): CERCLAGE CERVICAL (N/A). A Time Out was held and the above information confirmed.  She was then placed under spinal anesthesia without difficulty. She was placed in the dorsal lithotomy position, and was prepped and draped in a sterile manner.  A straight catheterization was performed. A sterile weighted speculum was inserted into the vagina and the cervix was grasped using an Allice clamp.  A #1 Prolene suture was placed at 12 o'clock position at the junction of the vaginal mucosa and the level of the internal cervical os.  The suture was run in a purse-string fashion with approximately 5 bites around the cervix.  The suture was then tied with knot at the 12 o'clock position.  A smooth forceps was used to attempt to probe the cervical canal with obstruction noted, confirming successful technique. All instruments were then removed from the patient's vagina.   The patient tolerated the procedures well.  All instruments, needles, and sponge counts were correct x 2.  The patient was taken to the recovery room awake, extubated and in stable condition.   Estimated Blood Loss:  minimal      Drains: straight catheterization prior to procedure with  50 ml of clear urine         Total IV Fluids: 300 ml  Specimens: None         Implants: None         Complications:  None; patient tolerated the procedure well.         Disposition: PACU - hemodynamically stable.         Condition: stable   Rubie Maid, MD [facility]

## 2022-03-28 NOTE — Anesthesia Procedure Notes (Signed)
Spinal  Patient location during procedure: OR Start time: 03/28/2022 8:54 AM End time: 03/28/2022 8:55 AM Reason for block: surgical anesthesia Staffing Performed: anesthesiologist  Anesthesiologist: Martha Clan, MD Performed by: Biagio Borg, CRNA Authorized by: Martha Clan, MD   Preanesthetic Checklist Completed: patient identified, IV checked, site marked, risks and benefits discussed, surgical consent, monitors and equipment checked, pre-op evaluation and timeout performed Spinal Block Patient position: sitting Prep: DuraPrep Patient monitoring: heart rate, cardiac monitor, continuous pulse ox and blood pressure Approach: midline Location: L3-4 Injection technique: single-shot Needle Needle type: Sprotte  Needle gauge: 24 G Needle length: 9 cm Assessment Sensory level: T8 Events: CSF return Additional Notes IV functioning, monitors applied to pt. Expiration date of kit checked and confirmed to be in date. Sterile prep and drape, hand hygiene and sterile gloved used. Pt was positioned and spine was prepped in sterile fashion. Skin was anesthetized with lidocaine. Free flow of clear CSF obtained prior to injecting local anesthetic into CSF x 1 attempt. Spinal needle aspirated freely following injection. Needle was carefully withdrawn, and pt tolerated procedure well. Loss of motor and sensory on exam post injection.

## 2022-03-28 NOTE — Transfer of Care (Signed)
Immediate Anesthesia Transfer of Care Note  Patient: Michelle Miller  Procedure(s) Performed: CERCLAGE CERVICAL (Vagina )  Patient Location: PACU  Anesthesia Type:Spinal  Level of Consciousness: awake  Airway & Oxygen Therapy: Patient Spontanous Breathing  Post-op Assessment: Report given to RN and Post -op Vital signs reviewed and stable  Post vital signs: Reviewed and stable  Last Vitals:  Vitals Value Taken Time  BP 100/63 03/28/22 0935  Temp 37.2 C 03/28/22 0935  Pulse 57 03/28/22 0937  Resp 14 03/28/22 0937  SpO2 100 % 03/28/22 0937  Vitals shown include unvalidated device data.  Last Pain:  Vitals:   03/28/22 8786  TempSrc: Temporal  PainSc: 0-No pain         Complications: No notable events documented.

## 2022-03-28 NOTE — Discharge Instructions (Signed)

## 2022-03-28 NOTE — H&P (Signed)
GYNECOLOGY PREOPERATIVE HISTORY AND PHYSICAL   Subjective:  Michelle Miller is a 31 y.o. [id number] here for surgical management of cervical cerclage due to history of 2nd trimester loss in last pregnancy (approximately 17 weeks). Suspected cervical incompetency. No significant preoperative concerns.  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   Past Surgical History:  Procedure Laterality Date   TONSILLECTOMY     age 74    OB History  Gravida Para Term Preterm AB Living  4 1 1  0 2 1  SAB IAB Ectopic Multiple Live Births  1 0 0 0 1    # Outcome Date GA Lbr Len/2nd Weight Sex Delivery Anes PTL Lv  4 Current           3 AB 09/16/21 [redacted]w[redacted]d            Birth Comments: died a few minutes after birth  2 SAB 04/2021 [redacted]w[redacted]d         1 Term 11/17/17 [redacted]w[redacted]d 09:01 / 01:09 3050 g M Vag-Spont EPI  LIV     Birth Comments: Temp=100.4    Family History  Problem Relation Age of Onset   Asthma Mother    Heart disease Mother    Diabetes Mother    Heart attack Mother    Asthma Son    Asthma Maternal Grandmother    Heart disease Maternal Grandmother    Hypertension Maternal Grandmother    Heart attack Maternal Grandmother        2009   Cancer Maternal Grandfather 72       pancreatic   Stroke Neg Hx     Social History   Socioeconomic History   Marital status: Single    Spouse name: Not on file   Number of children: 1   Years of education: 16   Highest education level: Not on file  Occupational History   Occupation: Fraud and Investigation analyist  Tobacco Use   Smoking status: Never   Smokeless tobacco: Never  Vaping Use   Vaping Use: Never used  Substance and Sexual Activity   Alcohol use: Not Currently    Comment: social   Drug use: No   Sexual activity: Yes     Partners: Male    Birth control/protection: None  Other Topics Concern   Not on file  Social History Narrative   Not on file   Social Determinants of Health   Financial Resource Strain: Low Risk  (01/31/2022)   Overall Financial Resource Strain (CARDIA)    Difficulty of Paying Living Expenses: Not hard at all  Food Insecurity: No Food Insecurity (01/31/2022)   Hunger Vital Sign    Worried About Running Out of Food in the Last Year: Never true    Ran Out of Food in the Last Year: Never true  Transportation Needs: No Transportation Needs (01/31/2022)   PRAPARE - 02/02/2022 (Medical): No    Lack of Transportation (Non-Medical): No  Physical Activity: Insufficiently Active (01/31/2022)   Exercise Vital Sign    Days of Exercise per Week: 3 days    Minutes of Exercise per Session: 20 min  Stress: No Stress Concern Present (01/31/2022)   02/02/2022 of Occupational Health - Occupational Stress  Questionnaire    Feeling of Stress : Not at all  Social Connections: Moderately Integrated (01/31/2022)   Social Connection and Isolation Panel [NHANES]    Frequency of Communication with Friends and Family: More than three times a week    Frequency of Social Gatherings with Friends and Family: Three times a week    Attends Religious Services: More than 4 times per year    Active Member of Clubs or Organizations: No    Attends Banker Meetings: Never    Marital Status: Living with partner  Intimate Partner Violence: Not At Risk (01/31/2022)   Humiliation, Afraid, Rape, and Kick questionnaire    Fear of Current or Ex-Partner: No    Emotionally Abused: No    Physically Abused: No    Sexually Abused: No    No current facility-administered medications on file prior to encounter.   Current Outpatient Medications on File Prior to Encounter  Medication Sig Dispense Refill   ferrous sulfate 325 (65 FE) MG tablet Take 325 mg by mouth daily with  breakfast.     Prenatal Vit-Fe Fumarate-FA (PRENATAL 19) 29-1 MG CHEW Chew 1 tablet by mouth daily at 6 (six) AM.     No Known Allergies    Review of Systems Constitutional: No recent fever/chills/sweats Respiratory: No recent cough/bronchitis Cardiovascular: No chest pain Gastrointestinal: No recent nausea/vomiting/diarrhea Genitourinary: No UTI symptoms Hematologic/lymphatic:No history of coagulopathy or recent blood thinner use    Objective:   Blood pressure 114/69, pulse 92, temperature 98.4 F (36.9 C), temperature source Temporal, resp. rate 17, height 5\' 2"  (1.575 m), weight 59 kg, last menstrual period 12/13/2021, SpO2 100 %. CONSTITUTIONAL: Well-developed, well-nourished female in no acute distress.  HENT:  Normocephalic, atraumatic, External right and left ear normal. Oropharynx is clear and moist EYES: Conjunctivae and EOM are normal. Pupils are equal, round, and reactive to light. No scleral icterus.  NECK: Normal range of motion, supple, no masses SKIN: Skin is warm and dry. No rash noted. Not diaphoretic. No erythema. No pallor. NEUROLOGIC: Alert and oriented to person, place, and time. Normal reflexes, muscle tone coordination. No cranial nerve deficit noted. PSYCHIATRIC: Normal mood and affect. Normal behavior. Normal judgment and thought content. CARDIOVASCULAR: Normal heart rate noted, regular rhythm RESPIRATORY: Effort and breath sounds normal, no problems with respiration noted ABDOMEN: Soft, nontender, nondistended. Gravid. FHR 158 bpm.  PELVIC: Deferred MUSCULOSKELETAL: Normal range of motion. No edema and no tenderness. 2+ distal pulses.    Labs: Results for orders placed or performed during the hospital encounter of 03/27/22 (from the past 336 hour(s))  CBC   Collection Time: 03/27/22  9:15 AM  Result Value Ref Range   WBC 10.0 4.0 - 10.5 K/uL   RBC 3.74 (L) 3.87 - 5.11 MIL/uL   Hemoglobin 10.7 (L) 12.0 - 15.0 g/dL   HCT 03/29/22 (L) 16.1 - 09.6 %   MCV  86.4 80.0 - 100.0 fL   MCH 28.6 26.0 - 34.0 pg   MCHC 33.1 30.0 - 36.0 g/dL   RDW 04.5 40.9 - 81.1 %   Platelets 303 150 - 400 K/uL   nRBC 0.0 0.0 - 0.2 %     Imaging Studies: 91.4 MFM OB COMPLETE LESS THAN 14 WEEKS  Result Date: 03/19/2022 ----------------------------------------------------------------------  OBSTETRICS REPORT                       (Signed Final 03/19/2022 03:50 pm) ---------------------------------------------------------------------- Patient Info  ID #:  [id number]                          D.O.B.:  March 06, 1991 (30 yrs)  Name:       INARA DIKE                  Visit Date: 03/19/2022 02:13 pm ---------------------------------------------------------------------- Performed By  Attending:        Braxton Feathers DO       Ref. Address:     [address]                                                             [place], [place]                                                             [address]  Performed By:     Reinaldo Raddle            [facility]  Referred By:      Mirna Mires CNM ---------------------------------------------------------------------- Orders  #  Description                           Code        Ordered By  1  [place] MFM OB COMP LESS THAN              76801.4     MARGARET FRYER     14 WEEKS ----------------------------------------------------------------------  #  Order #                     Accession #                Episode #  1  [id number]                   [id number]                 [id number] ---------------------------------------------------------------------- Indications  Poor obstetric history: Previous midtrimester  O09.299  loss (add cerv  insuff if applicable)  Cervical incompetence, first trimester         O34.31  [redacted] weeks gestation of pregnancy                Z3A.13 ---------------------------------------------------------------------- Fetal Evaluation  Num Of Fetuses:         1  Fetal Heart Rate(bpm):  155  Cardiac Activity:       Observed  Presentation:           Variable  Placenta:               Visualized  Amniotic Fluid  AFI FV:      Visualized ---------------------------------------------------------------------- Biometry  CRL:        83  mm     G. Age:  13w 6d                  EDD:   09/18/22  BPD:      44.8  mm     G. Age:  19w 4d                  CI:        223.5   %    70 - 86                                                                          4                                                          FL/HC:      11.8   %  HC:      101.8  mm     G. Age:  14w 5d                  HC/AC:      1.25        1.14 - 1.31  AC:       81.7  mm     G. Age:  14w 4d                  FL/BPD:     26.8   %  FL:         12  mm     G. Age:  13w 4d                  FL/AC:      14.7   %    20 -  24  Est. FW:      92  gm      0 lb 3 oz ---------------------------------------------------------------------- OB History  Gravidity:    4         Term:   1         SAB:   2  Living:       1 ---------------------------------------------------------------------- Gestational Age  LMP:           13w 5d        Date:  12/13/21                   EDD:   09/19/22  U/S Today:     15w 4d                                        EDD:   09/06/22  Best:          13w 5d     Det. By:  LMP  (12/13/21)          EDD:   09/19/22 ---------------------------------------------------------------------- Anatomy  Cranium:               Visualized             Cord Vessels:           Visualized  Choroid Plexus:        Visualized             Kidneys:                Visualized  Heart:                 Visualized             Bladder:                Visualized  Diaphragm:             Visualized              Spine:                  Visualized  Stomach:               Visualized ---------------------------------------------------------------------- Cervix Uterus Adnexa  Cervix  Normal appearance by transabdominal scan.  Uterus  No abnormality visualized.  Right Ovary  Not visualized.  Left Ovary  Not visualized.  Cul De Sac  No free fluid seen.  Adnexa  No adnexal mass visualized. ---------------------------------------------------------------------- Comments  Sonographic findings  Single intrauterine pregnancy with normal cardiac activity  No gross abnormalities identified although this is limited due  to the gestational age  Normal-appearing amniotic fluid  The placenta is anterior  Crown-rump length is consistent with her prior ultrasound and  LMP  Due to the gestational age transvaginal ultrasound was not  performed  MFM Consult Note  Patient Name: KARYS MECKLEY  Patient MRN:   [id number]  Referring provider: [facility]  Reason for Consult: History of cervical insufficiency  HPI: LUIZA CARRANCO is a 31 y.o. G4P1021 at [redacted]w[redacted]d by  LMP confirmed with early ultrasound.  The patient is here for a consultation for history of a 16-week  6-day loss.  She reports she had 1 term vaginal delivery  without complications followed by a miscarriage at 8 weeks.  There was no need for instrumentation during or after her  miscarriage.  The only procedure  she had in her cervix was  what sounds like a LEEP procedure after her 16-week loss.  During the specific pregnancy where she had the loss at 16w  she reports that she had no symptoms at all until she noticed  a bulge when she was going to the bathroom.  She fell down  and noticed the bag of water.  She said she she went into the  hospital and shortly delivered afterwards.  She denies vaginal  bleeding or contractions prior to this event.  She has  researched cerclage and is most interested in this option.  We discussed aneuploidy screening and she is not  interested.  She discussed  this would not change her  decision to continue the pregnancy or have a cerclage.  Review of Systems: A review of systems was performed and  was negative except per HPI  Past Obstetrical History:  OB History  Gravida  Para     Term     Preterm  AB       Living  4        1        1         0        2        1  SAB      IAB      Ectopic  Multiple Live Births  1        0        0        0        1  #        Outcome           Date     GA       Lbr Len/2nd  Len/2nd  Weight   Sex      Delivery Anes     PTL      Lv  4        Current  3        AB       09/16/21 [redacted]w[redacted]d     Birth Comments: died a few minutes after birth  2        SAB      04/2021  [redacted]w[redacted]d  1        Term     11/17/17 [redacted]w[redacted]d    09:01 / 01:09     6 lb  11.6 oz (3.05 kg) M        Vag-Spont         EPI  Spont    EPI               LIV     Birth Comments: Temp=100.4  Physical Exam:  See intake sheet for vitals  Sitting comfortably on the sonogram table  Nonlabored breathing  Normal rate and rhythm  Assessment  Bryant Manley is a 31 y.o. Z5G3875 at [redacted]w[redacted]d by LMP  consistent with early [place] here for consultation  1.  History of cervical insufficiency  - Based on her history, I recommend history indicated  cerclage.  I also discussed the alternatives of transvaginal  cervical length screening and expectant management.  The  patient declined these options and strongly desires a cervical  cerclage. I discussed this with Dr. Amalia Hailey who has agreed to  schedule the patient for a consultation for cerclage as well as  a preoperative visit. I discussed the risks and benefits and  alternatives to a  cervical cerclage.  The patient is very  interested in the procedure and would like to proceed forward  with this.  -I discussed the role of aneuploidy screening and the patient  declined at this time.  The first trimester ultrasound appears  grossly normal but is limited due to gestational age.  -I have also ordered the patient's anatomic survey to be done  around 18 to 19 weeks.  If the  referring provider would like to  do this at their office this can be canceled.  Thank you for the opportunity to be involved with this patient's  care. Please let us know if we can be of any further  assistance.  Braxton FeathersBurk Schaible  MFM,   03/19/2022  2:52 PM ----------------------------------------------------------------------                   Braxton FeathersBurk Schaible, DO Electronically Signed Final Report   03/19/2022 03:50 pm ----------------------------------------------------------------------   Assessment:    History of second trimester loss Cervical incompetency   Plan:    Counseling: Procedure, risks, reasons, benefits and complications (including injury to bowel, bladder, major blood vessel, ureter, bleeding, possibility of transfusion, infection, or fistula formation) reviewed in detail. Likelihood of success in alleviating the patient's condition was discussed. Routine postoperative instructions will be reviewed with the patient and her family in detail after surgery.  The patient concurred with the proposed plan, giving informed written consent for the surgery.   Preop testing reviewed, normal. Has been NPO after midnight.      Hildred Laserherry, Littie Chiem, MD [facility]

## 2022-03-31 ENCOUNTER — Encounter: Payer: Self-pay | Admitting: Obstetrics and Gynecology

## 2022-03-31 NOTE — Anesthesia Postprocedure Evaluation (Signed)
Anesthesia Post Note  Patient: Michelle Miller  Procedure(s) Performed: CERCLAGE CERVICAL (Vagina )  Patient location during evaluation: PACU Anesthesia Type: Spinal Level of consciousness: awake and alert Pain management: pain level controlled Vital Signs Assessment: post-procedure vital signs reviewed and stable Respiratory status: spontaneous breathing and respiratory function stable Cardiovascular status: blood pressure returned to baseline and stable Postop Assessment: spinal receding Anesthetic complications: no   No notable events documented.   Last Vitals:  Vitals:   03/28/22 1030 03/28/22 1042  BP: 113/68 109/67  Pulse: (!) 57 64  Resp: 12 16  Temp: 36.6 C (!) 36.4 C  SpO2: 100% 100%    Last Pain:  Vitals:   03/31/22 0823  TempSrc:   PainSc: 1                  Martha Clan

## 2022-04-10 ENCOUNTER — Ambulatory Visit (INDEPENDENT_AMBULATORY_CARE_PROVIDER_SITE_OTHER): Payer: BC Managed Care – PPO | Admitting: Obstetrics and Gynecology

## 2022-04-10 ENCOUNTER — Encounter: Payer: Self-pay | Admitting: Obstetrics and Gynecology

## 2022-04-10 VITALS — BP 119/64 | HR 76 | Wt 130.0 lb

## 2022-04-10 DIAGNOSIS — O3432 Maternal care for cervical incompetence, second trimester: Secondary | ICD-10-CM

## 2022-04-10 DIAGNOSIS — O34219 Maternal care for unspecified type scar from previous cesarean delivery: Secondary | ICD-10-CM

## 2022-04-10 DIAGNOSIS — Z2821 Immunization not carried out because of patient refusal: Secondary | ICD-10-CM

## 2022-04-10 DIAGNOSIS — N883 Incompetence of cervix uteri: Secondary | ICD-10-CM

## 2022-04-10 DIAGNOSIS — O0991 Supervision of high risk pregnancy, unspecified, first trimester: Secondary | ICD-10-CM

## 2022-04-10 DIAGNOSIS — Z8759 Personal history of other complications of pregnancy, childbirth and the puerperium: Secondary | ICD-10-CM

## 2022-04-10 DIAGNOSIS — Z3A16 16 weeks gestation of pregnancy: Secondary | ICD-10-CM

## 2022-04-10 LAB — POCT URINALYSIS DIPSTICK OB
Bilirubin, UA: NEGATIVE
Blood, UA: NEGATIVE
Glucose, UA: NEGATIVE
Ketones, UA: NEGATIVE
Leukocytes, UA: NEGATIVE
Nitrite, UA: NEGATIVE
POC,PROTEIN,UA: NEGATIVE
Spec Grav, UA: 1.02 (ref 1.010–1.025)
Urobilinogen, UA: 0.2 E.U./dL
pH, UA: 6.5 (ref 5.0–8.0)

## 2022-04-10 NOTE — Progress Notes (Signed)
ROB. Patient states no concerns had cerclage done and has had no issues or concerns after procedure.

## 2022-04-10 NOTE — Progress Notes (Signed)
ROB: patient presents today for routine OB visit and post-op visit. Is s/p cervical cerclage placement at [redacted] weeks gestation due to h/o prior preterm loss and cervical incompetency. Doing well, no issues. Pelvic exam notes small amount of thin white discharge, cerclage suture visible with knots. Cervix appears closed. Has anatomy scan with MFM  in 2 weeks.  Continue pelvic rest until then. Declines flu vaccine. RTC in 4 weeks for OB visit.

## 2022-04-11 ENCOUNTER — Encounter: Payer: BC Managed Care – PPO | Admitting: Obstetrics and Gynecology

## 2022-04-23 ENCOUNTER — Other Ambulatory Visit: Payer: Self-pay | Admitting: Maternal & Fetal Medicine

## 2022-04-23 ENCOUNTER — Ambulatory Visit: Payer: BC Managed Care – PPO | Attending: Obstetrics and Gynecology

## 2022-04-23 ENCOUNTER — Other Ambulatory Visit: Payer: Self-pay | Admitting: *Deleted

## 2022-04-23 ENCOUNTER — Encounter: Payer: Self-pay | Admitting: Maternal & Fetal Medicine

## 2022-04-23 DIAGNOSIS — Z362 Encounter for other antenatal screening follow-up: Secondary | ICD-10-CM

## 2022-04-23 DIAGNOSIS — O09299 Supervision of pregnancy with other poor reproductive or obstetric history, unspecified trimester: Secondary | ICD-10-CM

## 2022-04-23 DIAGNOSIS — O3432 Maternal care for cervical incompetence, second trimester: Secondary | ICD-10-CM | POA: Insufficient documentation

## 2022-04-23 DIAGNOSIS — O09292 Supervision of pregnancy with other poor reproductive or obstetric history, second trimester: Secondary | ICD-10-CM

## 2022-04-23 DIAGNOSIS — Z3A18 18 weeks gestation of pregnancy: Secondary | ICD-10-CM

## 2022-05-05 DIAGNOSIS — M5489 Other dorsalgia: Secondary | ICD-10-CM | POA: Diagnosis not present

## 2022-05-08 ENCOUNTER — Ambulatory Visit (INDEPENDENT_AMBULATORY_CARE_PROVIDER_SITE_OTHER): Payer: BC Managed Care – PPO | Admitting: Obstetrics and Gynecology

## 2022-05-08 ENCOUNTER — Encounter: Payer: Self-pay | Admitting: Obstetrics and Gynecology

## 2022-05-08 VITALS — BP 113/71 | HR 82 | Wt 133.5 lb

## 2022-05-08 DIAGNOSIS — Z8759 Personal history of other complications of pregnancy, childbirth and the puerperium: Secondary | ICD-10-CM

## 2022-05-08 DIAGNOSIS — O0991 Supervision of high risk pregnancy, unspecified, first trimester: Secondary | ICD-10-CM

## 2022-05-08 DIAGNOSIS — Z3A20 20 weeks gestation of pregnancy: Secondary | ICD-10-CM

## 2022-05-08 DIAGNOSIS — O3432 Maternal care for cervical incompetence, second trimester: Secondary | ICD-10-CM

## 2022-05-08 LAB — POCT URINALYSIS DIPSTICK OB
Bilirubin, UA: NEGATIVE
Blood, UA: NEGATIVE
Glucose, UA: NEGATIVE
Ketones, UA: NEGATIVE
Leukocytes, UA: NEGATIVE
Nitrite, UA: NEGATIVE
POC,PROTEIN,UA: NEGATIVE
Spec Grav, UA: 1.015 (ref 1.010–1.025)
Urobilinogen, UA: 0.2 E.U./dL
pH, UA: 6.5 (ref 5.0–8.0)

## 2022-05-08 NOTE — Progress Notes (Signed)
ROB: Patient doing well.  Has no complaints.  Denies vaginal bleeding.  Has cerclage in place for 16-week loss.  To complete anatomy scan at MFM in the next week.  Reporting daily fetal movement.

## 2022-05-08 NOTE — Progress Notes (Signed)
ROB. Patient states daily fetal movement, denies pain or pressure at this time. She states doing well since cerclage. Reminded patient to schedule follow-up anatomy ultrasound in 2 weeks, patient stated she will call today. Patient states no other questions or concerns at this time.

## 2022-05-21 ENCOUNTER — Ambulatory Visit: Payer: BC Managed Care – PPO | Attending: Maternal & Fetal Medicine

## 2022-05-21 ENCOUNTER — Ambulatory Visit: Payer: BC Managed Care – PPO | Admitting: *Deleted

## 2022-05-21 VITALS — BP 108/62 | HR 100

## 2022-05-21 DIAGNOSIS — O3432 Maternal care for cervical incompetence, second trimester: Secondary | ICD-10-CM

## 2022-05-21 DIAGNOSIS — Z362 Encounter for other antenatal screening follow-up: Secondary | ICD-10-CM | POA: Insufficient documentation

## 2022-05-21 DIAGNOSIS — Z3A22 22 weeks gestation of pregnancy: Secondary | ICD-10-CM

## 2022-05-21 DIAGNOSIS — O09292 Supervision of pregnancy with other poor reproductive or obstetric history, second trimester: Secondary | ICD-10-CM | POA: Diagnosis not present

## 2022-05-21 DIAGNOSIS — Z9889 Other specified postprocedural states: Secondary | ICD-10-CM | POA: Insufficient documentation

## 2022-05-21 DIAGNOSIS — O09299 Supervision of pregnancy with other poor reproductive or obstetric history, unspecified trimester: Secondary | ICD-10-CM | POA: Insufficient documentation

## 2022-06-05 ENCOUNTER — Ambulatory Visit (INDEPENDENT_AMBULATORY_CARE_PROVIDER_SITE_OTHER): Payer: BC Managed Care – PPO | Admitting: Certified Nurse Midwife

## 2022-06-05 VITALS — BP 121/70 | HR 101 | Wt 136.0 lb

## 2022-06-05 DIAGNOSIS — Z3482 Encounter for supervision of other normal pregnancy, second trimester: Secondary | ICD-10-CM

## 2022-06-05 DIAGNOSIS — Z3A24 24 weeks gestation of pregnancy: Secondary | ICD-10-CM

## 2022-06-05 LAB — POCT URINALYSIS DIPSTICK OB
Bilirubin, UA: NEGATIVE
Blood, UA: NEGATIVE
Glucose, UA: NEGATIVE
Ketones, UA: NEGATIVE
Leukocytes, UA: NEGATIVE
Nitrite, UA: NEGATIVE
POC,PROTEIN,UA: NEGATIVE
Spec Grav, UA: 1.01 (ref 1.010–1.025)
Urobilinogen, UA: 0.2 E.U./dL
pH, UA: 7.5 (ref 5.0–8.0)

## 2022-06-05 NOTE — Progress Notes (Signed)
ROB doing well, feeling good movements. Denies concerns today. Has questions regarding cerclage removal. All questions answered. Discussed glucose test next visit. Information sheet given. Follow up 4 wks.   Doreene Burke, CNM

## 2022-06-05 NOTE — Addendum Note (Signed)
Addended by: Fonda Kinder on: 06/05/2022 11:31 AM   Modules accepted: Orders

## 2022-06-05 NOTE — Patient Instructions (Signed)
Oral Glucose Tolerance Test During Pregnancy Why am I having this test? The oral glucose tolerance test (OGTT) is done to check how your body processes blood sugar (glucose). This is one of several tests used to diagnose diabetes that develops during pregnancy (gestational diabetes mellitus). Gestational diabetes is a short-term form of diabetes that some women develop while they are pregnant. It usually occurs during the second trimester of pregnancy and goes away after delivery. Testing, or screening, for gestational diabetes usually occurs at weeks 24-28 of pregnancy. You may have the OGTT test after having a 1-hour glucose screening test if the results from that test indicate that you may have gestational diabetes. This test may also be needed if: You have a history of gestational diabetes. There is a history of giving birth to very large babies or of losing pregnancies (having stillbirths). You have signs and symptoms of diabetes, such as: Changes in your eyesight. Tingling or numbness in your hands or feet. Changes in hunger, thirst, and urination, and these are not explained by your pregnancy. What is being tested? This test measures the amount of glucose in your blood at different times during a period of 3 hours. This shows how well your body can process glucose. What kind of sample is taken?  Blood samples are required for this test. They are usually collected by inserting a needle into a blood vessel. How do I prepare for this test? For 3 days before your test, eat normally. Have plenty of carbohydrate-rich foods. Follow instructions from your health care provider about: Eating or drinking restrictions on the day of the test. You may be asked not to eat or drink anything other than water (to fast) starting 8-10 hours before the test. Changing or stopping your regular medicines. Some medicines may interfere with this test. Tell a health care provider about: All medicines you are  taking, including vitamins, herbs, eye drops, creams, and over-the-counter medicines. Any blood disorders you have. Any surgeries you have had. Any medical conditions you have. What happens during the test? First, your blood glucose will be measured. This is referred to as your fasting blood glucose because you fasted before the test. Then, you will drink a glucose solution that contains a certain amount of glucose. Your blood glucose will be measured again 1, 2, and 3 hours after you drink the solution. This test takes about 3 hours to complete. You will need to stay at the testing location during this time. During the testing period: Do not eat or drink anything other than the glucose solution. Do not exercise. Do not use any products that contain nicotine or tobacco, such as cigarettes, e-cigarettes, and chewing tobacco. These can affect your test results. If you need help quitting, ask your health care provider. The testing procedure may vary among health care providers and hospitals. How are the results reported? Your results will be reported as milligrams of glucose per deciliter of blood (mg/dL) or millimoles per liter (mmol/L). There is more than one source for screening and diagnosis reference values used to diagnose gestational diabetes. Your health care provider will compare your results to normal values that were established after testing a large group of people (reference values). Reference values may vary among labs and hospitals. For this test (Carpenter-Coustan), reference values are: Fasting: 95 mg/dL (5.3 mmol/L). 1 hour: 180 mg/dL (10.0 mmol/L). 2 hour: 155 mg/dL (8.6 mmol/L). 3 hour: 140 mg/dL (7.8 mmol/L). What do the results mean? Results below the reference values are   considered normal. If two or more of your blood glucose levels are at or above the reference values, you may be diagnosed with gestational diabetes. If only one level is high, your health care provider may  suggest repeat testing or other tests to confirm a diagnosis. Talk with your health care provider about what your results mean. Questions to ask your health care provider Ask your health care provider, or the department that is doing the test: When will my results be ready? How will I get my results? What are my treatment options? What other tests do I need? What are my next steps? Summary The oral glucose tolerance test (OGTT) is one of several tests used to diagnose diabetes that develops during pregnancy (gestational diabetes mellitus). Gestational diabetes is a short-term form of diabetes that some women develop while they are pregnant. You may have the OGTT test after having a 1-hour glucose screening test if the results from that test show that you may have gestational diabetes. You may also have this test if you have any symptoms or risk factors for this type of diabetes. Talk with your health care provider about what your results mean. This information is not intended to replace advice given to you by your health care provider. Make sure you discuss any questions you have with your health care provider. Document Revised: 12/31/2021 Document Reviewed: 11/03/2019 Elsevier Patient Education  2023 Elsevier Inc. Round Ligament Pain  The round ligaments are a pair of cord-like tissues that help support the uterus. They can become a source of pain during pregnancy as the ligaments soften and stretch as the baby grows. The pain usually begins in the second trimester (13-28 weeks) of pregnancy, and should only last for a few seconds when it occurs. However, the pain can come and go until the baby is delivered. The pain does not cause harm to the baby. Round ligament pain is usually a short, sharp, and pinching pain, but it can also be a dull, lingering, and aching pain. The pain is felt in the lower side of the abdomen or in the groin. It usually starts deep in the groin and moves up to the  outside of the hip area. The pain may happen when you: Suddenly change position, such as quickly going from a sitting to standing position. Do physical activity. Cough or sneeze. Follow these instructions at home: Managing pain  When the pain starts, relax. Then, try any of these methods to help with the pain: Sit down. Flex your knees up to your abdomen. Lie on your side with one pillow under your abdomen and another pillow between your legs. Sit in a warm bath for 15-20 minutes or until the pain goes away. General instructions Watch your condition for any changes. Move slowly when you sit down or stand up. Stop or reduce your physical activities if they cause pain. Avoid long walks if they cause pain. Take over-the-counter and prescription medicines only as told by your health care provider. Keep all follow-up visits. This is important. Contact a health care provider if: Your pain does not go away with treatment. You feel pain in your back that you did not have before. Your medicine is not helping. You have a fever or chills. You have nausea or vomiting. You have diarrhea. You have pain when you urinate. Get help right away if: You have pain that is a rhythmic, cramping pain similar to labor pains. Labor pains are usually 2 minutes apart, last for about  1 minute, and involve a bearing down feeling or pressure in your pelvis. You have vaginal bleeding. These symptoms may represent a serious problem that is an emergency. Do not wait to see if the symptoms will go away. Get medical help right away. Call your local emergency services (911 in the [place] Do not drive yourself to the hospital. Summary Round ligament pain is felt in the lower abdomen or groin. This pain usually begins in the second trimester (13-28 weeks) and should only last for a few seconds when it occurs. You may notice the pain when you suddenly change position, when you cough or sneeze, or during physical  activity. Relaxing, flexing your knees to your abdomen, lying on one side, or taking a warm bath may help to get rid of the pain. Contact your health care provider if the pain does not go away. This information is not intended to replace advice given to you by your health care provider. Make sure you discuss any questions you have with your health care provider. Document Revised: 08/08/2020 Document Reviewed: 08/08/2020 Elsevier Patient Education  2023 [employer].

## 2022-06-09 NOTE — L&D Delivery Note (Signed)
OB/GYN Faculty Practice Delivery Note  Michelle Miller is a 32 y.o. [id number] s/p SVD at [redacted]w[redacted]d She was admitted for PPROM.   ROM: rupture date, rupture time, delivery date, or delivery time have not been documented clear   GBS Status:  NEGATIVE/-- (01/25 0257) Maximum Maternal Temperature:  Temp (24hrs), Avg:98.4 F (36.9 C), Min:98 F (36.7 C), Max:98.7 F (37.1 C)    Labor Progress: Patient arrived at 0 cm dilation with cerclage in place.  Cerclage was removed due to pain.  Patient started on magnesium and given betamethasone.  Labor progress spontaneously.  Delivery Date/Time: 07/20/2022 at 0001 Delivery: Called to room due to prolonged decelerations and considerable bleeding.  What appeared to be 100 mL of blood was in the patient's bed.  Cervical exam showed cervix at 8 cm with fetal head coming through the house.  Patient pushed once and the cervix dilated and fetus came to +2 station.  With 1 additional push head delivered in direct OA position. No nuchal cord present. Shoulder and body delivered in usual fashion. Infant with spontaneous cry, placed on mother's abdomen, dried and stimulated. Cord clamped x 2 after 1-minute delay, and cut by FOB. Cord blood drawn. Placenta delivered spontaneously with gentle cord traction. Fundus firm with massage and Pitocin. Labia, perineum, vagina, and cervix inspected with no lacerations.  Considerable bleeding continues to lower uterine sleep was performed.  Large amount of adhered clot appreciated in lower uterine segment.  Patient was given TXA and Methergine.  Bleeding improved but continued to patient was given Cytotec.  Placenta: Spontaneous, intact Complications: None Lacerations: None EBL: 288 mL Analgesia: Epidural in place  Infant: APGAR (1 MIN): 6   APGAR (5 MINS): 8   APGAR (10 MINS):    Weight: 1850 g  VGifford Shave MD  OB fellow 07/20/2022 1:04 AM

## 2022-07-02 ENCOUNTER — Other Ambulatory Visit: Payer: Self-pay

## 2022-07-02 ENCOUNTER — Inpatient Hospital Stay
Admission: EM | Admit: 2022-07-02 | Discharge: 2022-07-03 | Disposition: A | Discharge status: Other Acute Inpatient Hospital | Payer: BC Managed Care – PPO | Attending: Licensed Practical Nurse | Admitting: Licensed Practical Nurse

## 2022-07-02 ENCOUNTER — Encounter: Payer: Self-pay | Admitting: Obstetrics and Gynecology

## 2022-07-02 DIAGNOSIS — O42913 Preterm premature rupture of membranes, unspecified as to length of time between rupture and onset of labor, third trimester: Secondary | ICD-10-CM | POA: Diagnosis not present

## 2022-07-02 DIAGNOSIS — Z3A28 28 weeks gestation of pregnancy: Secondary | ICD-10-CM | POA: Diagnosis not present

## 2022-07-02 NOTE — OB Triage Note (Signed)
Michelle Miller 32 y.o. presents to Labor & Delivery triage via wheelchair steered by ED staff reporting possible rupture of membranes around 2200 tonight. She is a [id number] at [redacted]w[redacted]d . She denies signs and symptoms consistent with active vaginal bleeding. She denies contractions and states positive fetal movement. External FM and TOCO applied to non-tender abdomen. Initial FHR 135. Vital signs obtained and within normal limits. Patient oriented to care environment including call bell and bed control use. Roberto Scales, CNM notified of patient's arrival.

## 2022-07-03 ENCOUNTER — Encounter: Payer: BC Managed Care – PPO | Admitting: Advanced Practice Midwife

## 2022-07-03 ENCOUNTER — Inpatient Hospital Stay (HOSPITAL_COMMUNITY)
Admission: AD | Admit: 2022-07-03 | Discharge: 2022-07-22 | DRG: 768 | Disposition: A | Discharge status: Home / Self Care | Payer: BC Managed Care – PPO | Attending: Obstetrics and Gynecology | Admitting: Obstetrics and Gynecology

## 2022-07-03 ENCOUNTER — Encounter (HOSPITAL_COMMUNITY): Payer: Self-pay | Admitting: Obstetrics and Gynecology

## 2022-07-03 ENCOUNTER — Telehealth: Payer: Self-pay | Admitting: Obstetrics and Gynecology

## 2022-07-03 DIAGNOSIS — O26893 Other specified pregnancy related conditions, third trimester: Secondary | ICD-10-CM | POA: Diagnosis not present

## 2022-07-03 DIAGNOSIS — Z8759 Personal history of other complications of pregnancy, childbirth and the puerperium: Secondary | ICD-10-CM | POA: Diagnosis not present

## 2022-07-03 DIAGNOSIS — O09293 Supervision of pregnancy with other poor reproductive or obstetric history, third trimester: Secondary | ICD-10-CM | POA: Diagnosis not present

## 2022-07-03 DIAGNOSIS — O4593 Premature separation of placenta, unspecified, third trimester: Secondary | ICD-10-CM | POA: Diagnosis present

## 2022-07-03 DIAGNOSIS — N883 Incompetence of cervix uteri: Secondary | ICD-10-CM | POA: Diagnosis present

## 2022-07-03 DIAGNOSIS — Z3A29 29 weeks gestation of pregnancy: Secondary | ICD-10-CM | POA: Diagnosis not present

## 2022-07-03 DIAGNOSIS — D649 Anemia, unspecified: Secondary | ICD-10-CM | POA: Diagnosis not present

## 2022-07-03 DIAGNOSIS — O0990 Supervision of high risk pregnancy, unspecified, unspecified trimester: Secondary | ICD-10-CM

## 2022-07-03 DIAGNOSIS — O42913 Preterm premature rupture of membranes, unspecified as to length of time between rupture and onset of labor, third trimester: Principal | ICD-10-CM | POA: Diagnosis present

## 2022-07-03 DIAGNOSIS — O3432 Maternal care for cervical incompetence, second trimester: Secondary | ICD-10-CM | POA: Diagnosis not present

## 2022-07-03 DIAGNOSIS — Z3A28 28 weeks gestation of pregnancy: Secondary | ICD-10-CM

## 2022-07-03 DIAGNOSIS — Z369 Encounter for antenatal screening, unspecified: Secondary | ICD-10-CM | POA: Diagnosis not present

## 2022-07-03 DIAGNOSIS — N898 Other specified noninflammatory disorders of vagina: Secondary | ICD-10-CM | POA: Diagnosis not present

## 2022-07-03 DIAGNOSIS — Z3A31 31 weeks gestation of pregnancy: Secondary | ICD-10-CM | POA: Diagnosis not present

## 2022-07-03 DIAGNOSIS — O9902 Anemia complicating childbirth: Secondary | ICD-10-CM | POA: Diagnosis not present

## 2022-07-03 DIAGNOSIS — O3433 Maternal care for cervical incompetence, third trimester: Secondary | ICD-10-CM | POA: Diagnosis not present

## 2022-07-03 DIAGNOSIS — O42013 Preterm premature rupture of membranes, onset of labor within 24 hours of rupture, third trimester: Secondary | ICD-10-CM | POA: Diagnosis not present

## 2022-07-03 DIAGNOSIS — O09893 Supervision of other high risk pregnancies, third trimester: Secondary | ICD-10-CM | POA: Diagnosis not present

## 2022-07-03 DIAGNOSIS — Z3A30 30 weeks gestation of pregnancy: Secondary | ICD-10-CM | POA: Diagnosis not present

## 2022-07-03 LAB — CBC
HCT: 29.6 % — ABNORMAL LOW (ref 36.0–46.0)
Hemoglobin: 10.1 g/dL — ABNORMAL LOW (ref 12.0–15.0)
MCH: 29.4 pg (ref 26.0–34.0)
MCHC: 34.1 g/dL (ref 30.0–36.0)
MCV: 86.3 fL (ref 80.0–100.0)
Platelets: 283 10*3/uL (ref 150–400)
RBC: 3.43 MIL/uL — ABNORMAL LOW (ref 3.87–5.11)
RDW: 11.6 % (ref 11.5–15.5)
WBC: 9.6 10*3/uL (ref 4.0–10.5)
nRBC: 0 % (ref 0.0–0.2)

## 2022-07-03 LAB — GROUP B STREP BY PCR: Group B strep by PCR: NEGATIVE

## 2022-07-03 LAB — WET PREP, GENITAL
Clue Cells Wet Prep HPF POC: NONE SEEN
Sperm: NONE SEEN
Trich, Wet Prep: NONE SEEN
WBC, Wet Prep HPF POC: <10 (ref <10)
Yeast Wet Prep HPF POC: NONE SEEN

## 2022-07-03 LAB — TYPE AND SCREEN
ABO/RH(D): AB POS
Antibody Screen: NEGATIVE

## 2022-07-03 LAB — RUPTURE OF MEMBRANE (ROM)PLUS: Rom Plus: POSITIVE

## 2022-07-03 MED ORDER — ONDANSETRON HCL 4 MG/2ML IJ SOLN
4.0000 mg | Freq: Four times a day (QID) | INTRAMUSCULAR | Status: DC | PRN
  Administered 2022-07-03 – 2022-07-20 (×4): 4 mg via INTRAVENOUS
  Filled 2022-07-03 (×4): qty 2

## 2022-07-03 MED ORDER — MAGNESIUM SULFATE 40 GM/1000ML IV SOLN: 2.0000 g/h | INTRAVENOUS | Status: DC

## 2022-07-03 MED ORDER — BETAMETHASONE SOD PHOS & ACET 6 (3-3) MG/ML IJ SUSP
12.0000 mg | INTRAMUSCULAR | Status: DC
  Administered 2022-07-03: 12 mg via INTRAMUSCULAR
  Filled 2022-07-03: qty 5

## 2022-07-03 MED ORDER — LACTATED RINGERS IV SOLN: INTRAVENOUS | Status: DC

## 2022-07-03 MED ORDER — AZITHROMYCIN 500 MG PO TABS
1000.0000 mg | ORAL_TABLET | Freq: Once | ORAL | Status: DC
  Filled 2022-07-03: qty 2

## 2022-07-03 MED ORDER — AMOXICILLIN 500 MG PO CAPS
500.0000 mg | ORAL_CAPSULE | Freq: Three times a day (TID) | ORAL | Status: DC
  Filled 2022-07-03: qty 1

## 2022-07-03 MED ORDER — MAGNESIUM SULFATE 40 GM/1000ML IV SOLN
INTRAVENOUS | Status: AC
  Administered 2022-07-03: 4 g via INTRAVENOUS
  Filled 2022-07-03: qty 1000

## 2022-07-03 MED ORDER — PRENATAL MULTIVITAMIN CH
1.0000 | ORAL_TABLET | Freq: Every day | ORAL | Status: DC
  Administered 2022-07-03 – 2022-07-19 (×17): 1 via ORAL
  Filled 2022-07-03 (×17): qty 1

## 2022-07-03 MED ORDER — SODIUM CHLORIDE 0.9 % IV SOLN
2.0000 g | Freq: Four times a day (QID) | INTRAVENOUS | Status: DC
  Administered 2022-07-03: 2 g via INTRAVENOUS
  Filled 2022-07-03: qty 2000

## 2022-07-03 MED ORDER — SODIUM CHLORIDE 0.9 % IV SOLN
2.0000 g | Freq: Four times a day (QID) | INTRAVENOUS | Status: AC
  Administered 2022-07-03 – 2022-07-05 (×8): 2 g via INTRAVENOUS
  Filled 2022-07-03 (×8): qty 2000

## 2022-07-03 MED ORDER — MAGNESIUM SULFATE BOLUS VIA INFUSION
4.0000 g | Freq: Once | INTRAVENOUS | Status: AC
  Filled 2022-07-03: qty 1000

## 2022-07-03 MED ORDER — LACTATED RINGERS IV SOLN: 125.0000 mL/h | INTRAVENOUS | Status: AC

## 2022-07-03 MED ORDER — MAGNESIUM SULFATE 40 GM/1000ML IV SOLN
2.0000 g/h | INTRAVENOUS | Status: AC
  Administered 2022-07-03: 2 g/h via INTRAVENOUS
  Filled 2022-07-03: qty 1000

## 2022-07-03 MED ORDER — BETAMETHASONE SOD PHOS & ACET 6 (3-3) MG/ML IJ SUSP
12.0000 mg | Freq: Once | INTRAMUSCULAR | Status: AC
  Administered 2022-07-04: 12 mg via INTRAMUSCULAR
  Filled 2022-07-03: qty 5

## 2022-07-03 MED ORDER — MAGNESIUM SULFATE BOLUS VIA INFUSION
4.0000 g | Freq: Once | INTRAVENOUS | Status: DC
  Filled 2022-07-03: qty 1000

## 2022-07-03 MED ORDER — AMOXICILLIN 500 MG PO CAPS
500.0000 mg | ORAL_CAPSULE | Freq: Three times a day (TID) | ORAL | Status: AC
  Administered 2022-07-05 – 2022-07-09 (×15): 500 mg via ORAL
  Filled 2022-07-03 (×15): qty 1

## 2022-07-03 MED ORDER — ACETAMINOPHEN 325 MG PO TABS: 650.0000 mg | ORAL_TABLET | ORAL | Status: DC | PRN

## 2022-07-03 MED ORDER — DOCUSATE SODIUM 100 MG PO CAPS
100.0000 mg | ORAL_CAPSULE | Freq: Every day | ORAL | Status: DC
  Administered 2022-07-04 – 2022-07-19 (×16): 100 mg via ORAL
  Filled 2022-07-03 (×15): qty 1

## 2022-07-03 MED ORDER — CALCIUM CARBONATE ANTACID 500 MG PO CHEW: 2.0000 | CHEWABLE_TABLET | ORAL | Status: DC | PRN

## 2022-07-03 MED ORDER — AZITHROMYCIN 250 MG PO TABS
1000.0000 mg | ORAL_TABLET | Freq: Once | ORAL | Status: AC
  Administered 2022-07-03: 1000 mg via ORAL
  Filled 2022-07-03: qty 4

## 2022-07-03 NOTE — Telephone Encounter (Signed)
Contacted patient to follow up after transfer of care to [facility] last night due to PPROM at 28.[redacted] weeks gestation. Patient notes she is doing "as well as can be expected".  Denies contractions.  Understands that she will be admitted for at least several weeks or until delivery, whichever is earliest. Advised that we will also continue to intermittently check in with her progress.  Will cancel upcoming OB appointment for now.    Rubie Maid, MD [facility] at [facility]

## 2022-07-03 NOTE — H&P (Signed)
FACULTY PRACTICE ANTEPARTUM ADMISSION HISTORY AND PHYSICAL NOTE   History of Present Illness: Michelle Miller is a 32 y.o. [id number] at [redacted]w[redacted]d admitted for PROM.  The patient states she felt a gush of fluid around 2230 and then continued leaking fluid.  She does have a cerclage that was placed on 03/28/22.  There is a history of a second trimester loss as well.    She was transferred from [facility] with documented PROM from gross rupture and positive ROM plus.  She is currently comfortable and denies any significant uterine activity.    Patient reports the fetal movement as active. Patient reports uterine contraction  activity as none. Patient reports  vaginal bleeding as none. Patient describes fluid per vagina as Clear. Fetal presentation is unsure.  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    Past Surgical History:  Procedure Laterality Date   CERVICAL CERCLAGE N/A 03/28/2022   Procedure: CERCLAGE CERVICAL;  Surgeon: Rubie Maid, MD;  Location: [facility] ORS;  Service: Gynecology;  Laterality: N/A;   TONSILLECTOMY     age 89    OB History  Gravida Para Term Preterm AB Living  4 2 1 1 1 1   SAB IAB Ectopic Multiple Live Births  1 0 0 0 1    # Outcome Date GA Lbr Len/2nd Weight Sex Delivery Anes PTL Lv  4 Current           3 Preterm 09/16/21 [redacted]w[redacted]d       ND     Birth Comments: died a few minutes after birth  2 SAB 04/2021 [redacted]w[redacted]d         1 Term 11/17/17 [redacted]w[redacted]d 09:01 / 01:09 3050 g M Vag-Spont EPI  LIV     Birth Comments: Temp=100.4     Social History   Socioeconomic History   Marital status: Single    Spouse name: Not on file   Number of children: 1   Years of education: 16   Highest education level: Not on file  Occupational History   Occupation: Fraud and Investigation analyist  Tobacco Use   Smoking status: Never   Smokeless tobacco: Never  Vaping Use   Vaping Use: Never used  Substance and Sexual Activity   Alcohol use: Not Currently    Comment: social   Drug use: No   Sexual activity: Yes    Partners: Male    Birth control/protection: None  Other Topics Concern   Not on file  Social History Narrative   Not on file   Social Determinants of Health   Financial Resource Strain: Low Risk  (01/31/2022)   Overall Financial Resource Strain (CARDIA)    Difficulty of Paying Living Expenses: Not hard at all  Food Insecurity: No Food Insecurity (07/03/2022)   Hunger Vital Sign    Worried About Running Out of Food in the Last Year: Never true    Ran Out of Food in the Last Year: Never true  Transportation Needs: No Transportation Needs (07/03/2022)   PRAPARE - Transportation  Lack of Transportation (Medical): No    Lack of Transportation (Non-Medical): No  Physical Activity: Insufficiently Active (01/31/2022)   Exercise Vital Sign    Days of Exercise per Week: 3 days    Minutes of Exercise per Session: 20 min  Stress: No Stress Concern Present (01/31/2022)   [employer] of Occupational Health - Occupational Stress Questionnaire    Feeling of Stress : Not at all  Social Connections: Moderately Integrated (01/31/2022)   Social Connection and Isolation Panel [NHANES]    Frequency of Communication with Friends and Family: More than three times a week    Frequency of Social Gatherings with Friends and Family: Three times a week    Attends Religious Services: More than 4 times per year    Active Member of Clubs or Organizations: No    Attends Banker Meetings: Never    Marital Status:  Living with partner    Family History  Problem Relation Age of Onset   Asthma Mother    Heart disease Mother    Diabetes Mother    Heart attack Mother    Asthma Son    Asthma Maternal Grandmother    Heart disease Maternal Grandmother    Hypertension Maternal Grandmother    Heart attack Maternal Grandmother        2009   Cancer Maternal Grandfather 63       pancreatic   Stroke Neg Hx     No Known Allergies  Medications Prior to Admission  Medication Sig Dispense Refill Last Dose   ferrous sulfate 325 (65 FE) MG tablet Take 325 mg by mouth daily with breakfast. (Patient not taking: Reported on 07/02/2022)      NIFEdipine (PROCARDIA) 10 MG capsule Take 1 capsule (10 mg total) by mouth 3 (three) times daily for 4 days. If cramping/contractions start 12 capsule 0    Prenatal Vit-Fe Fumarate-FA (PRENATAL 19) 29-1 MG CHEW Chew 1 tablet by mouth daily at 6 (six) AM.       Review of Systems - History obtained from chart review and the patient  Vitals:  BP (!) 115/58 (BP Location: Right Arm)   Pulse 77   Temp 98.5 F (36.9 C) (Oral)   Resp 18   Ht 5\' 2"  (1.575 m)   Wt 61.8 kg   LMP 12/13/2021 (Exact Date)   SpO2 100%   BMI 24.91 kg/m  Physical Examination: CONSTITUTIONAL: Well-developed, well-nourished female in no acute distress.  HENT:  Normocephalic, atraumatic, External right and left ear normal. Oropharynx is clear and moist EYES: Conjunctivae and EOM are normal. NECK: Normal range of motion, supple, no masses SKIN: Skin is warm and dry. No rash noted. Not diaphoretic. No erythema. No pallor. NEUROLGIC: Alert and oriented to person, place, and time. Normal reflexes, muscle tone coordination. No cranial nerve deficit noted. PSYCHIATRIC: Normal mood and affect. Normal behavior. Normal judgment and thought content. CARDIOVASCULAR: Normal heart rate noted, regular rhythm, 3/6 systolic murmur noted RESPIRATORY: Effort and breath sounds normal, no problems with respiration  noted ABDOMEN: Soft, nontender, nondistended, gravid. MUSCULOSKELETAL: Normal range of motion. No edema and no tenderness. 2+ distal pulses.  Cervix:deferred and fetal presentation is unsure. Membranes:ruptured Fetal Monitoring:Baseline: 130s bpm, Variability: moderate, Accelerations: Non-reactive but appropriate for gestational age, and Decelerations: occasional variable Tocometer: irritability  Labs:  Results for orders placed or performed during the hospital encounter of 07/03/22 (from the past 24 hour(s))  Type and screen [facility]   Collection Time: 07/03/22  5:24 AM  Result Value Ref Range   ABO/RH(D) PENDING    Antibody Screen PENDING    Sample Expiration      07/06/2022,2359 Performed at [facility], [place] [address] [place], [place] [address]   Results for orders placed or performed during the hospital encounter of 07/02/22 (from the past 24 hour(s))  ROM Plus ([place] only)   Collection Time: 07/02/22 11:37 PM  Result Value Ref Range   Rom Plus POSITIVE   Wet prep, genital   Collection Time: 07/03/22  2:20 AM  Result Value Ref Range   Yeast Wet Prep HPF POC NONE SEEN NONE SEEN   Trich, Wet Prep NONE SEEN NONE SEEN   Clue Cells Wet Prep HPF POC NONE SEEN NONE SEEN   WBC, Wet Prep HPF POC <10 <10   Sperm NONE SEEN   CBC   Collection Time: 07/03/22  2:20 AM  Result Value Ref Range   WBC 9.6 4.0 - 10.5 K/uL   RBC 3.43 (L) 3.87 - 5.11 MIL/uL   Hemoglobin 10.1 (L) 12.0 - 15.0 g/dL   HCT 29.6 (L) 36.0 - 46.0 %   MCV 86.3 80.0 - 100.0 fL   MCH 29.4 26.0 - 34.0 pg   MCHC 34.1 30.0 - 36.0 g/dL   RDW 11.6 11.5 - 15.5 %   Platelets 283 150 - 400 K/uL   nRBC 0.0 0.0 - 0.2 %  Type and screen [place]   Collection Time: 07/03/22  2:20 AM  Result Value Ref Range   ABO/RH(D) PENDING    Antibody Screen PENDING    Sample Expiration      07/06/2022,2359 Performed at [facility], [place] [place], [place]  [id number]   Group B strep by PCR   Collection Time: 07/03/22  2:57 AM  Result Value Ref Range   Group B strep by PCR NEGATIVE NEGATIVE    Imaging Studies: No results found.   Assessment and Plan: Patient Active Problem List   Diagnosis Date Noted   Premature labor with rupture of membranes in third trimester 07/03/2022   Cervical cerclage suture present in second trimester 04/10/2022   Supervision of high risk pregnancy, antepartum 01/31/2022   History of perinatal fetal loss 10/29/2021   Cervical incompetence 09/16/2021   Admit to Antenatal Betamethasone x 2 doses, 2nd dose at 0300 07/04/21 Magnesium sulfate for CP prophylaxis Will recheck presentation if she does progress in preterm labor to determine route of delivery Will provide latency abx NICU consult today  Lynnda Shields, MD

## 2022-07-03 NOTE — H&P (Signed)
OB History & Physical   History of Present Illness:  Chief Complaint:   HPI:  Michelle Miller is a 32 y.o. [phone] female at [redacted]w[redacted]d dated by L and 10 wk [place] .  She presents to L&D for leaking clear fluid. Around 2230 she was sitting on her couch and felt fluid release. She continues to feel episodes of leaking fluid. She denies contractions. She endorses+FM.  Michelle Miller has had early and regular prenatal care. Her pregnancy is complicated by a second trimester loss in 2023. ON 10/20.23 she had cervical cerclage placed.   On arrival to the unit Pt grossly ruptured. ROM plus positive.    Pregnancy Issues: 1. Hx of second trimester loss 2. Cervical Cerclage in place  3. 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 Authorizing Provider  Prenatal Vit-Fe Fumarate-FA (PRENATAL 19) 29-1 MG CHEW Chew 1 tablet by mouth daily at 6 (six) AM.   Yes [provider]  ferrous sulfate 325 (65 FE) MG tablet Take 325 mg by mouth daily with breakfast. Patient not taking: Reported on 07/02/2022    [provider]  NIFEdipine (PROCARDIA) 10 MG capsule Take 1 capsule (10 mg total) by mouth 3 (three) times daily for 4 days. If cramping/contractions start 03/28/22 04/01/22  Rubie Maid, MD     Prenatal care site: [place]  [facility]   Social History: She  reports that she has never smoked. She has never used smokeless tobacco. She reports that she does not currently use alcohol. She reports that she does not use  drugs.  Family History: family history includes Asthma in her maternal grandmother, mother, and son; Cancer (age of onset: 7) in her maternal grandfather; Diabetes in her mother; Heart attack in her maternal grandmother and mother; Heart disease in her maternal grandmother and mother; Hypertension in her maternal grandmother.   Review of Systems: A full review of systems was performed and negative except as noted in the HPI.     Physical Exam:  Vital Signs: BP 111/64 (BP Location: Right Arm)   Pulse 86   Temp 98.6 F (37 C) (Oral)   Resp 19   LMP 12/13/2021 (Exact Date)  General: no acute distress.  HEENT: normocephalic, atraumatic Heart: regular rate & rhythm.  No murmurs/rubs/gallops Lungs: clear to auscultation bilaterally, normal respiratory effort Abdomen: soft, gravid, non-tender;   Pelvic:   External: Normal external female genitalia  Cervix:   /   /   not assessed    Extremities: non-tender, symmetric, no edema bilaterally.   Neurologic: Alert & oriented x 3.    Results for orders placed or performed during the hospital encounter of 07/02/22 (from the past 24 hour(s))  ROM Plus ([place] only)     Status: None   Collection Time: 07/02/22 11:37 PM  Result Value Ref Range   Rom Plus POSITIVE     Pertinent Results:  Prenatal Labs: Blood type/Rh AB Positive  Antibody screen neg  Rubella Immune  Varicella Immune  RPR NR  HBsAg Neg  HIV NR  GC neg  Chlamydia neg  Genetic screening negative  1 hour GTT   3 hour GTT   GBS   PAP NILM 02/28/2022    AYT:[id number] 135, moderate variability, pos accel, neg decel  TOCO:irritability present  SVE:    /   /   deferred    Cephalic by leopolds  No results found.  Assessment:  Michelle Miller is a 32 y.o. [phone] female at [redacted]w[redacted]d with PPROM.   Plan:  Admit to Labor & Delivery, Plan to transfer to higher level of care: [facility] called, MD On call to call back to discuss transfer.  CBC, T&S, Clrs, IVF GBS   collected  Consents obtained. Continuous efm/toco Latency antibiotics and Betamethasone ordered   ----- Roberto Scales, [place]

## 2022-07-03 NOTE — Progress Notes (Signed)
Physician Final Progress Note  Patient ID: Michelle Miller MRN: [id number] DOB/AGE: [birth year] 32 y.o.  Admit date: 07/02/2022 Admitting provider: Rubie Maid, MD Discharge date: 07/03/2022   Admission Diagnoses:  1) intrauterine pregnancy at [redacted]w[redacted]d  2) PROM  Discharge Diagnoses:  Active Problems:   * No active hospital problems. *  PPROM   History of Present Illness: Michelle Miller is a 32 y.o. [phone] female at [redacted]w[redacted]d dated by L and 10 wk [place] .  She presents to L&D for leaking clear fluid. Around 2230 she was sitting on her couch and felt fluid release. She continues to feel episodes of leaking fluid. She denies contractions. She endorses+FM.   Josclyn has had early and regular prenatal care. Her pregnancy is complicated by a second trimester loss in 2023. ON 10/20.23 she had cervical cerclage placed.    On arrival to the unit Pt grossly ruptured. 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   Current Outpatient Medications on File Prior to Encounter  Medication Sig Dispense Refill   Prenatal Vit-Fe Fumarate-FA (PRENATAL 19) 29-1 MG CHEW Chew 1 tablet by mouth daily at 6 (six) AM.     ferrous sulfate 325 (65 FE) MG tablet Take 325 mg by mouth daily with breakfast. (Patient not taking: Reported on 07/02/2022)     NIFEdipine (PROCARDIA) 10 MG capsule Take 1 capsule (10 mg total) by mouth 3 (three) times daily for 4 days. If cramping/contractions start 12 capsule 0    No Known Allergies  Social History   Socioeconomic History   Marital status:  Single    Spouse name: Not on file   Number of children: 1   Years of education: 16   Highest education level: Not on file  Occupational History   Occupation: Fraud and Investigation analyist  Tobacco Use   Smoking status: Never   Smokeless tobacco: Never  Vaping Use   Vaping Use: Never used  Substance and Sexual Activity   Alcohol use: Not Currently    Comment: social   Drug use: No   Sexual activity: Yes    Partners: Male    Birth control/protection: None  Other Topics Concern   Not on file  Social History Narrative   Not on file   Social Determinants of Health   Financial Resource Strain: Low Risk  (01/31/2022)   Overall Financial Resource Strain (CARDIA)    Difficulty of Paying Living Expenses: Not hard at all  Food Insecurity: No Food Insecurity (01/31/2022)   Hunger Vital Sign    Worried About Running Out of Food in the Last Year: Never true    Ran Out of Food in the Last Year: Never true  Transportation Needs: No Transportation Needs (01/31/2022)   PRAPARE - Hydrologist (Medical): No    Lack of Transportation (Non-Medical): No  Physical Activity: Insufficiently Active (01/31/2022)   Exercise Vital Sign    Days of Exercise per Week: 3 days    Minutes of Exercise per  Session: 20 min  Stress: No Stress Concern Present (01/31/2022)   Day    Feeling of Stress : Not at all  Social Connections: Moderately Integrated (01/31/2022)   Social Connection and Isolation Panel [NHANES]    Frequency of Communication with Friends and Family: More than three times a week    Frequency of Social Gatherings with Friends and Family: Three times a week    Attends Religious Services: More than 4 times per year    Active Member of Clubs or Organizations: No    Attends Archivist Meetings: Never    Marital Status: Living with partner  Intimate Partner Violence: Not At Risk  (01/31/2022)   Humiliation, Afraid, Rape, and Kick questionnaire    Fear of Current or Ex-Partner: No    Emotionally Abused: No    Physically Abused: No    Sexually Abused: No    Family History  Problem Relation Age of Onset   Asthma Mother    Heart disease Mother    Diabetes Mother    Heart attack Mother    Asthma Son    Asthma Maternal Grandmother    Heart disease Maternal Grandmother    Hypertension Maternal Grandmother    Heart attack Maternal Grandmother        2009   Cancer Maternal Grandfather 63       pancreatic   Stroke Neg Hx      ROS see HPI   Physical Exam: BP 111/64 (BP Location: Right Arm)   Pulse 86   Temp 98.6 F (37 C) (Oral)   Resp 19   LMP 12/13/2021 (Exact Date)   Physical Exam Constitutional:      Appearance: Normal appearance.  Genitourinary:     Genitourinary Comments: Exam deferred   Cardiovascular:     Rate and Rhythm: Normal rate and regular rhythm.  Pulmonary:     Effort: Pulmonary effort is normal.     Breath sounds: Normal breath sounds.  Abdominal:     Tenderness: There is no abdominal tenderness.     Comments: Gravid   Musculoskeletal:     Cervical back: Normal range of motion.     Right lower leg: No edema.     Left lower leg: Edema present.  Neurological:     General: No focal deficit present.     Mental Status: She is alert.  Skin:    General: Skin is warm.  Psychiatric:        Mood and Affect: Mood normal.   EFM: Baseline 140, moderate variability, pos accel, neg decel  TOCO: no contractions   Consults: None  Significant Findings/ Diagnostic Studies: labs: ROM plus postive, GSB and wet prep pending   Procedures: RNST, Betamethasone x1   Hospital Course: The patient was admitted to Labor and Delivery Triage for observation. She had a RNST. Her Her ROM plus was positive. Given her early gestation the decision was made to transfer the pt to [facility] in [place]. Pt was agreeable to plan. All calls have been made.  [facility] transportation is enroute.   Discharge Condition: stable  Disposition:   Diet: Regular diet  Discharge Activity: Bedrest      Signed: [place], CNM  07/03/2022, 3:11 AM

## 2022-07-03 NOTE — Consult Note (Signed)
Consultation Service: Neonatology   Dr. Nelda Marseille has asked for consultation on Michelle Miller with MRN# [id number] regarding the care of a premature infant at [redacted]w[redacted]d. Thank you for inviting [place] to see this patient.   Reason for consult:  Explain the possible complications, the prognosis, and the care of a premature infant at [redacted]w[redacted]d.   Chief complaint: 60 female with PPROM at [redacted]w[redacted]d with an estimated weight of 555 gm on 12/13 at 22 weeks.   My key findings of this patient's HPI are:  I have reviewed the patient's chart and have met with her. The salient information is as follows:   Prenatal care:   good Pregnancy complications:  cervical incompetence, cerclage, history of perinatal fetal loss, PPROM Maternal antibiotics: yes, started 1/25 Maternal Steroid: First dose 1/25, second dose ordered 1/26 Receiving magnesium   Mother with a gush of fluid on 1/24 with continued leakage. [facility]red [facility] to this hospital.    My recommendations for this patient and my actions included:   1. I met with Michelle Miller, for 25 minutes discussing the possible complications and outcomes of prematurity at this gestational age. I discussed the potential need for resuscitation at birth, mechanical ventilation and surfactant administration for respiratory distress, IV fluids pending establishment of enteral feeds (encouraged breast milk feeding to which she planned to do), antibiotics for possible sepsis, temperature support, and monitoring. I also discussed the potential risk of complications such as intracranial hemorrhage, retinopathy, infection and chronic lung disease. I also discussed the potential length of stay in the neonatal intensive care unit for about 12 weeks. I discussed this with parents in detail and they expressed an understanding of the risks and complications of prematurity.   2. I informed her that the NICU team would be present at the delivery. We reviewed the normal course of resuscitation for an  infant at this gestational age including CPAP with the possibility of needing intubation. Would plan for central line placement. .   3. A visit to the NICU by the infant's mother and/or a significant other was encouraged. Visitation policy was discussed.    Davonna Belling, MD Attending Neonatologist Total time for consult was 35 min of which > 50% was face to face

## 2022-07-04 ENCOUNTER — Inpatient Hospital Stay (HOSPITAL_BASED_OUTPATIENT_CLINIC_OR_DEPARTMENT_OTHER): Payer: BC Managed Care – PPO

## 2022-07-04 DIAGNOSIS — O42913 Preterm premature rupture of membranes, unspecified as to length of time between rupture and onset of labor, third trimester: Secondary | ICD-10-CM | POA: Diagnosis not present

## 2022-07-04 DIAGNOSIS — Z369 Encounter for antenatal screening, unspecified: Secondary | ICD-10-CM | POA: Diagnosis not present

## 2022-07-04 DIAGNOSIS — Z3A29 29 weeks gestation of pregnancy: Secondary | ICD-10-CM

## 2022-07-04 DIAGNOSIS — O09293 Supervision of pregnancy with other poor reproductive or obstetric history, third trimester: Secondary | ICD-10-CM

## 2022-07-04 DIAGNOSIS — O3433 Maternal care for cervical incompetence, third trimester: Secondary | ICD-10-CM

## 2022-07-04 LAB — GC/CHLAMYDIA PROBE AMP (~~LOC~~) NOT AT ARMC
Chlamydia: NEGATIVE
Comment: NEGATIVE
Comment: NORMAL
Neisseria Gonorrhea: NEGATIVE

## 2022-07-04 NOTE — Progress Notes (Signed)
[place] NOTE  Carie Kapuscinski is a 32 y.o. [id number] at [redacted]w[redacted]d who is admitted for PROM.  Estimated Date of Delivery: 09/19/22 Fetal presentation is unsure.  Length of Stay:  1 Days. Admitted 07/03/2022  Subjective: Pt seen.  She is doing well.  No acute complaints.  Patient denies fever and chills.  There is no abdominal pain. Patient reports normal fetal movement.  She denies uterine contractions, denies bleeding.  There is slight  leaking of fluid per vagina.  Vitals:  Blood pressure 116/61, pulse 96, temperature 97.9 F (36.6 C), temperature source Oral, resp. rate 17, height 5\' 2"  (1.575 m), weight 61.8 kg, last menstrual period 12/13/2021, SpO2 100 %. Physical Examination: CONSTITUTIONAL: Well-developed, well-nourished female in no acute distress.  HENT:  Normocephalic, atraumatic, External right and left ear normal. Oropharynx is clear and moist EYES: Conjunctivae and EOM are normal.  NECK: Normal range of motion, supple, no masses. SKIN: Skin is warm and dry. No rash noted. Not diaphoretic. No erythema. No pallor. [place]: Alert and oriented to person, place, and time. Normal reflexes, muscle tone coordination. No cranial nerve deficit noted. PSYCHIATRIC: Normal mood and affect. Normal behavior. Normal judgment and thought content. CARDIOVASCULAR: Normal heart rate noted, regular rhythm RESPIRATORY: Effort and breath sounds normal, no problems with respiration noted MUSCULOSKELETAL: Normal range of motion. No edema and no tenderness. ABDOMEN: Soft, nontender, nondistended, gravid. CERVIX: deferred  Fetal monitoring: FHR: 120s bpm, Variability: moderate, Accelerations: Present, Decelerations: Absent , category 1 strip Uterine activity: none  Results for orders placed or performed during the hospital encounter of 07/03/22 (from the past 48 hour(s))  Type and screen [place]     Status: None   Collection Time: 07/03/22  5:24 AM   Result Value Ref Range   ABO/RH(D) AB POS    Antibody Screen NEG    Sample Expiration      07/06/2022,2359 Performed at [facility], [place] [address] [place], [place] [address]     I have reviewed the patient's current medications.  ASSESSMENT: Principal Problem:   Premature labor with rupture of membranes in third trimester Active Problems:   Cervical incompetence   History of perinatal fetal loss   Supervision of high risk pregnancy, antepartum   Cervical cerclage suture present in second trimester   PLAN: Magnesium sulfate for neuroprotection has been completed and should be discontinued. Pt is now s/p BMZ x 2 as of 07/04/22 No s/sx of preterm labor or maternal infection. Pt continue latency antibiotics. Currently day #2. NICU consult completed. Continue daily NST. Inpatient hospital stay until moving to delivery at 34 weeks or pending maternal/fetal status. Pt will need 2 hour GTT once out of steroid window,likely next week.   Continue routine antenatal care.   Lynnda Shields, MD [place] Attending, [facility] 07/04/2022 7:52 AM

## 2022-07-05 NOTE — Progress Notes (Signed)
[place]) NOTE  Michelle Miller is a 32 y.o. [id number] with Estimated Date of Delivery: 09/19/22   By  LMP [redacted]w[redacted]d  who is admitted for PROM.    Fetal presentation is cephalic. Length of Stay:  2  Days  Date of admission:07/03/2022  Subjective: Pt resting comfortably, reports no acute complaints overnight. Patient reports the fetal movement as active. Patient reports uterine contraction  activity as none. Patient reports  vaginal bleeding as none. Patient describes fluid per vagina as Clear.  Vitals:  Blood pressure (!) 90/43, pulse 68, temperature 98 F (36.7 C), temperature source Oral, resp. rate 16, height 5\' 2"  (1.575 m), weight 61.8 kg, last menstrual period 12/13/2021, SpO2 99 %. Vitals:   07/04/22 1930 07/04/22 2333 07/05/22 0331 07/05/22 0843  BP: (!) 118/55 (!) 105/47 112/61 (!) 90/43  Pulse: 82 72 62 68  Resp: 18 17 16 16   Temp: 98.3 F (36.8 C) 98.3 F (36.8 C) 98.2 F (36.8 C) 98 F (36.7 C)  TempSrc: Oral Oral Oral Oral  SpO2: 98% 99% 100% 99%  Weight:      Height:       Physical Examination:  General appearance - alert, well appearing, and in no distress Mental status - normal mood, behavior, speech, dress, motor activity, and thought processes Chest - CTAB Heart - normal rate and regular rhythm Abdomen - gravid, soft and non-tender Extremities - no edema, no calf tenderness bilaterally Skin - warm and dry  Fetal Monitoring:  Baseline: 135 bpm, Variability: moderate, Accelerations: +10x10 accels x 2, and Decelerations: Absent   reactive   Medications:  Scheduled  amoxicillin  500 mg Oral TID   docusate sodium  100 mg Oral Daily   prenatal multivitamin  1 tablet Oral Q1200   I have reviewed the patient's current medications.  ASSESSMENT: [id number] [redacted]w[redacted]d Estimated Date of Delivery: 09/19/22  Patient Active Problem List   Diagnosis Date Noted   Premature labor with rupture of membranes in third trimester 07/03/2022   Cervical  cerclage suture present in second trimester 04/10/2022   Supervision of high risk pregnancy, antepartum 01/31/2022   History of perinatal fetal loss 10/29/2021   Cervical incompetence 09/16/2021    PLAN: 1) PPROM -on Latency antibiotic Day 3/7 -s/p Magnesium for CP prophylaxis -BMZ completed 1/25-26 -no evidence of labor or infection  2) Fetal well being -reassuring NST  -continue twice daily monitoring -last growth scan 1/26- vertex, EFW 2#11oz (18%)  3) Maternal care -PNV daily -plan for glucola testing next week  DISPO: Antepartum care as above, plan for continued in-house monitoring with delivery at 34wks or if clinically indicated  Michelle Miller 07/05/2022,8:49 AM

## 2022-07-06 LAB — TYPE AND SCREEN
ABO/RH(D): AB POS
Antibody Screen: NEGATIVE

## 2022-07-06 LAB — CBC
HCT: 29.9 % — ABNORMAL LOW (ref 36.0–46.0)
Hemoglobin: 9.8 g/dL — ABNORMAL LOW (ref 12.0–15.0)
MCH: 29.9 pg (ref 26.0–34.0)
MCHC: 32.8 g/dL (ref 30.0–36.0)
MCV: 91.2 fL (ref 80.0–100.0)
Platelets: 291 10*3/uL (ref 150–400)
RBC: 3.28 MIL/uL — ABNORMAL LOW (ref 3.87–5.11)
RDW: 11.7 % (ref 11.5–15.5)
WBC: 10.9 10*3/uL — ABNORMAL HIGH (ref 4.0–10.5)
nRBC: 0 % (ref 0.0–0.2)

## 2022-07-06 NOTE — Progress Notes (Signed)
FACULTY PRACTICE ANTEPARTUM(COMPREHENSIVE) NOTE  Michelle Miller is a 32 y.o. [id number] with Estimated Date of Delivery: 09/19/22   By  LMP [redacted]w[redacted]d  who is admitted for PPROM.    Fetal presentation is cephalic. Length of Stay:  3  Days  Date of admission:07/03/2022  Subjective: Resting comfortably in bed, no acute complaint or concerns overnight. Patient reports the fetal movement as active. Patient reports uterine contraction  activity as none. Patient reports  vaginal bleeding as none. Continues to leak clear fluid- no change.  Vitals:  Blood pressure 110/66, pulse 85, temperature 98.2 F (36.8 C), temperature source Oral, resp. rate 16, height 5\' 2"  (1.575 m), weight 61.8 kg, last menstrual period 12/13/2021, SpO2 100 %. Vitals:   07/05/22 1946 07/06/22 0008 07/06/22 0333 07/06/22 0743  BP: (!) 119/56 (!) 103/46 (!) 125/57 110/66  Pulse: 68 63 62 85  Resp: 17 16 17 16   Temp: 98 F (36.7 C) 98 F (36.7 C) 97.8 F (36.6 C) 98.2 F (36.8 C)  TempSrc: Oral Oral Oral Oral  SpO2: 100% 100%  100%  Weight:      Height:       Physical Examination:  General appearance - alert, well appearing, and in no distress Mental status - normal mood and behavior Chest - CTAB Heart - +systolic murmur, regular rate Abdomen - gravid, soft and non-tender, no rebound, no guarding Extremities - no edema, no calf tenderness bilaterally Skin - warm and dry   Fetal Monitoring:  Baseline: 135 bpm, Variability: moderate, Accelerations: +15x15 accels present x 2, and Decelerations: Absent    reactive  Labs:  Results for orders placed or performed during the hospital encounter of 07/03/22 (from the past 24 hour(s))  Type and screen [facility]   Collection Time: 07/06/22  4:02 AM  Result Value Ref Range   ABO/RH(D) AB POS    Antibody Screen NEG    Sample Expiration      07/09/2022,2359 Performed at [facility], [address] [place], [address] [place]   CBC   Collection  Time: 07/06/22  4:10 AM  Result Value Ref Range   WBC 10.9 (H) 4.0 - 10.5 K/uL   RBC 3.28 (L) 3.87 - 5.11 MIL/uL   Hemoglobin 9.8 (L) 12.0 - 15.0 g/dL   HCT 88502 (L) 07/08/22 - 77.4 %   MCV 91.2 80.0 - 100.0 fL   MCH 29.9 26.0 - 34.0 pg   MCHC 32.8 30.0 - 36.0 g/dL   RDW 12.8 78.6 - 76.7 %   Platelets 291 150 - 400 K/uL   nRBC 0.0 0.0 - 0.2 %    Imaging Studies:    20.9 MFM OB FOLLOW UP  Result Date: 07/05/2022 ----------------------------------------------------------------------  OBSTETRICS REPORT                        (Signed Final 07/05/2022 12:15 pm) ---------------------------------------------------------------------- Patient Info  ID #:       07/07/2022                          D.O.B.:  1991/01/01 (31 yrs)  Name:       [id number]                  Visit Date: 07/04/2022 01:52 pm ---------------------------------------------------------------------- Performed By  Attending:        Odelia Gage      Ref. Address:  Herma Carson                    MD                                                              [address]                                                              [place], [place]  Performed By:     Valda Favia          [facility]                    [place]  Referred By:      Imagene Riches CNM ---------------------------------------------------------------------- Orders  #  Description                           Code        Ordered By  1  [place] MFM OB FOLLOW UP                   25366.44    Janyth Pupa ----------------------------------------------------------------------  #  Order #                     Accession #                Episode #  1  [id number]                   [id number]                 [id number]  ---------------------------------------------------------------------- Indications  [redacted] weeks gestation of pregnancy                 Z3A.29  Poor obstetric history: Previous midtrimester   O09.299  loss  Cervical cerclage suture present, second  O34.32  trimester  Premature rupture of membranes - leaking        O42.90  fluid  Encounter for antenatal screening,              Z36.9  unspecified ---------------------------------------------------------------------- Fetal Evaluation  Num Of Fetuses:          1  Fetal Heart Rate(bpm):   135  Cardiac Activity:        Observed  Presentation:            Cephalic  Placenta:                Anterior  P. Cord Insertion:       Visualized  Amniotic Fluid  AFI FV:      Subjectively decreased  AFI Sum(cm)     %Tile       Largest Pocket(cm)  6.1             < 3         3.1  RUQ(cm)       RLQ(cm)       LUQ(cm)        LLQ(cm)  2.1           0             3.1            0.9 ---------------------------------------------------------------------- Biometry  BPD:      70.1  mm     G. Age:  28w 1d         15  %    CI:        70.04   %    70 - 86                                                          FL/HC:       19.7  %    19.6 - 20.8  HC:      267.2  mm     G. Age:  29w 1d         20  %    HC/AC:       1.10       0.99 - 1.21  AC:        242  mm     G. Age:  28w 3d         28  %    FL/BPD:      75.2  %    71 - 87  FL:       52.7  mm     G. Age:  28w 0d         13  %    FL/AC:       21.8  %    20 - 24  Est. FW:    1216   gm    2 lb 11 oz     18  % ---------------------------------------------------------------------- OB History  Gravidity:    4         Term:   1         SAB:   2  Living:       1 ---------------------------------------------------------------------- Gestational Age  LMP:           29w 0d  Date:  12/13/21                   EDD:   09/19/22  U/S Today:     28w 3d                                        EDD:   09/23/22  Best:          29w 0d     Det. By:  LMP  (12/13/21)           EDD:   09/19/22 ---------------------------------------------------------------------- Anatomy  Cranium:               Appears normal         LVOT:                   Appears normal  Cavum:                 Previously             Aortic Arch:            Previously seen                         visualized  Ventricles:            Appears normal         Ductal Arch:            Previously seen  Choroid Plexus:        Previously seen        Diaphragm:              Previously seen  Cerebellum:            Previously seen        Stomach:                Appears normal, left                                                                        sided  Posterior Fossa:       Previously seen        Abdomen:                Previously seen  Nuchal Fold:           Previously seen        Abdominal Wall:         Previously seen  Face:                  Orbits and profile     Cord Vessels:           Previously seen                         previously seen  Lips:                  Previously seen        Kidneys:  Appear normal  Palate:                Previously             Bladder:                Appears normal                         visualized  Thoracic:              Appears normal         Spine:                  Previously seen  Heart:                 Appears normal         Upper Extremities:      Previously seen                         (4CH, axis, and                         situs)  RVOT:                  Appears normal         Lower Extremities:      Previously seen  Other:  Female gender previously seen. Heels  previously visualized. VC, 3VV          and 3VTV prev visualized. Open hands prev visualized. Nasal bone,          lenses, maxilla, mandible and falx visualized prev. ---------------------------------------------------------------------- Cervix Uterus Adnexa  Cervix  Not visualized (advanced GA >24wks)  Uterus  No abnormality visualized.  Right Ovary  No adnexal mass visualized.  Left Ovary  No adnexal mass  visualized.  Cul De Sac  No free fluid seen.  Adnexa  No abnormality visualized ---------------------------------------------------------------------- Impression  Follow up growth due to premature ruputure of membranes  Normal interval growth with measurements consistent with  dates  Good fetal movement and the amniotic fluid volume was  normal in measurement  but subjectly decreased.  Suboptimal views of the fetal again seen today due to fetal  position. ---------------------------------------------------------------------- Recommendations  Continue weekly AFI and serial growth.  Antenatal testing per inpatient team ----------------------------------------------------------------------              Lin Landsman, MD Electronically Signed Final Report   07/05/2022 12:15 pm ----------------------------------------------------------------------    Medications:  Scheduled  amoxicillin  500 mg Oral TID   docusate sodium  100 mg Oral Daily   prenatal multivitamin  1 tablet Oral Q1200   I have reviewed the patient's current medications.  ASSESSMENT: [id number] [redacted]w[redacted]d Estimated Date of Delivery: 09/19/22  Patient Active Problem List   Diagnosis Date Noted   Premature labor with rupture of membranes in third trimester 07/03/2022   Cervical cerclage suture present in second trimester 04/10/2022   Supervision of high risk pregnancy, antepartum 01/31/2022   History of perinatal fetal loss 10/29/2021   Cervical incompetence 09/16/2021    PLAN: 1) PPROM -Day 4/7 of latency antibiotics -s/p Mag and BMZ course (1/25-26) -no evidence of labor or infection  2) Fetal well being -reactive NST -continue twice daily monitoring -last growth scan 1/26- vertex, EFW 18%  3) Maternal care -PNV daily -glucola testing later next week  DISP: Continue in-house  antepartum care.  Plan for delivery @ 34wks or as clinically indicated.  Clearnce Sorrel Scotti Motter 07/06/2022,8:52 AM

## 2022-07-07 NOTE — Progress Notes (Addendum)
Patient ID: Michelle Miller, female   DOB: 1991/04/24, 32 y.o.   MRN: [id number] [place] ANTEPARTUM PROGRESS NOTE  Michelle Miller is a 32 y.o. [id number] at [redacted]w[redacted]d who is admitted for PROM.   Fetal presentation is unsure.  Length of Stay:  4 Days. Admitted 07/03/2022  Subjective: Patient report she is doing well and is without any complaints.  Patient denies fever and chills.  There is no abdominal pain.  Patient reports normal fetal movement.  She denies uterine contractions, denies bleeding.  Patient reports occasional leakage of clear fluid when she ambulates to the bathroom.  Vitals:  Blood pressure (!) 111/55, pulse 73, temperature 98.1 F (36.7 C), temperature source Oral, resp. rate 16, height 5\' 2"  (1.575 m), weight 61.8 kg, last menstrual period 12/13/2021, SpO2 100 %. Physical Examination: CONSTITUTIONAL: Well-developed, well-nourished female in no acute distress.  [place]: Alert and oriented to person, place, and time. Normal reflexes, muscle tone coordination. No cranial nerve deficit noted. PSYCHIATRIC: Normal mood and affect. Normal behavior. Normal judgment and thought content. CARDIOVASCULAR: Normal heart rate noted, regular rhythm RESPIRATORY: Effort and breath sounds normal, no problems with respiration noted MUSCULOSKELETAL: Normal range of motion. No edema and no tenderness. ABDOMEN: Soft, nontender,gravid. CERVIX: deferred  Fetal monitoring: FHR: 135 bpm, Variability: moderate, Accelerations: Present, Decelerations: Absent , Reactive and appropriate for gestational age Uterine activity: none  Results for orders placed or performed during the hospital encounter of 07/03/22 (from the past 48 hour(s))  Type and screen [place]     Status: None   Collection Time: 07/06/22  4:02 AM  Result Value Ref Range   ABO/RH(D) AB POS    Antibody Screen NEG    Sample Expiration      07/09/2022,2359 Performed at [facility], [place] [address],  [place], [place] [address]   CBC     Status: Abnormal   Collection Time: 07/06/22  4:10 AM  Result Value Ref Range   WBC 10.9 (H) 4.0 - 10.5 K/uL   RBC 3.28 (L) 3.87 - 5.11 MIL/uL   Hemoglobin 9.8 (L) 12.0 - 15.0 g/dL   HCT 29.9 (L) 36.0 - 46.0 %   MCV 91.2 80.0 - 100.0 fL   MCH 29.9 26.0 - 34.0 pg   MCHC 32.8 30.0 - 36.0 g/dL   RDW 11.7 11.5 - 15.5 %   Platelets 291 150 - 400 K/uL   nRBC 0.0 0.0 - 0.2 %    Comment: Performed at [facility], [place] [address], [place], [place] [address]    I have reviewed the patient's current medications.  ASSESSMENT: Principal Problem:   Premature labor with rupture of membranes in third trimester Active Problems:   Cervical incompetence   History of perinatal fetal loss   Supervision of high risk pregnancy, antepartum   Cervical cerclage suture present in second trimester   PLAN: Patient completed magnesium sulfate for neuroprotection Patient received a course of BMZ 1/25-1/26 Continue latency antibiotics Day 5/7 No s/sx of preterm labor or maternal infection. Fetal status is reassuring. Continue daily NST. Continue inpatient observation with plans for delivery at 34 weeks or pending maternal/fetal status. Patient with history of incompetent cervix with cerclage in situ Plan for glucose challenge test later given recent BMZ administration Continue routine antenatal care.   Mora Bellman, MD [place] Attending, Center for [facility] 07/07/2022 9:03 AM

## 2022-07-08 NOTE — Progress Notes (Signed)
Patient ID: Michelle Miller, female   DOB: January 11, 1991, 32 y.o.   MRN: [id number] [place] ANTEPARTUM PROGRESS NOTE  Michelle Miller is a 32 y.o. [id number] at [redacted]w[redacted]d who is admitted for PROM.   Fetal presentation is unsure.  Length of Stay:  5 Days. Admitted 07/03/2022  Subjective: Patient report she is doing well and is without any complaints.  Patient denies fever and chills.  There is no abdominal pain.  Patient reports normal fetal movement.  She denies uterine contractions, denies bleeding.  Patient reports occasional leakage of clear fluid when she ambulates to the bathroom.  Vitals:  Blood pressure 114/65, pulse 88, temperature 98.6 F (37 C), temperature source Oral, resp. rate 18, height 5\' 2"  (1.575 m), weight 61.8 kg, last menstrual period 12/13/2021, SpO2 100 %. Physical Examination: CONSTITUTIONAL: Well-developed, well-nourished female in no acute distress.  [place]: Alert and oriented to person, place, and time. Normal reflexes, muscle tone coordination. No cranial nerve deficit noted. PSYCHIATRIC: Normal mood and affect. Normal behavior. Normal judgment and thought content. CARDIOVASCULAR: Normal heart rate noted, regular rhythm RESPIRATORY: Effort and breath sounds normal, no problems with respiration noted MUSCULOSKELETAL: Normal range of motion. No edema and no tenderness. ABDOMEN: Soft, nontender,gravid. CERVIX: deferred  Fetal monitoring: FHR: 140 bpm, Variability: moderate, Accelerations: Present, Decelerations: Absent , Reactive and appropriate for gestational age Uterine activity: none  No results found for this or any previous visit (from the past 70 hour(s)).   I have reviewed the patient's current medications.  ASSESSMENT: Principal Problem:   Premature labor with rupture of membranes in third trimester Active Problems:   Cervical incompetence   History of perinatal fetal loss   Supervision of high risk pregnancy, antepartum   Cervical cerclage  suture present in second trimester   PLAN: Patient completed magnesium sulfate for neuroprotection Patient received a course of BMZ 1/25-1/26 Continue latency antibiotics Day 6/7 No s/sx of preterm labor or maternal infection. Fetal status is reassuring. Continue daily NST. Will schedule AFI check on 2/2 Continue inpatient observation with plans for delivery at 34 weeks or pending maternal/fetal status. Patient contemplating delaying delivery to 36 weeks.  Patient with history of incompetent cervix with cerclage in situ Plan for glucose challenge test later given recent BMZ administration Continue routine antenatal care.   Mora Bellman, MD [place] Attending, Center for [facility] 07/08/2022 10:58 AM

## 2022-07-09 ENCOUNTER — Telehealth: Payer: Self-pay

## 2022-07-09 ENCOUNTER — Encounter (HOSPITAL_COMMUNITY): Payer: Self-pay | Admitting: Obstetrics and Gynecology

## 2022-07-09 DIAGNOSIS — Z3A29 29 weeks gestation of pregnancy: Secondary | ICD-10-CM

## 2022-07-09 DIAGNOSIS — O42913 Preterm premature rupture of membranes, unspecified as to length of time between rupture and onset of labor, third trimester: Secondary | ICD-10-CM | POA: Diagnosis not present

## 2022-07-09 LAB — CBC
HCT: 31.1 % — ABNORMAL LOW (ref 36.0–46.0)
Hemoglobin: 10.6 g/dL — ABNORMAL LOW (ref 12.0–15.0)
MCH: 29.4 pg (ref 26.0–34.0)
MCHC: 34.1 g/dL (ref 30.0–36.0)
MCV: 86.4 fL (ref 80.0–100.0)
Platelets: 286 10*3/uL (ref 150–400)
RBC: 3.6 MIL/uL — ABNORMAL LOW (ref 3.87–5.11)
RDW: 11.6 % (ref 11.5–15.5)
WBC: 11 10*3/uL — ABNORMAL HIGH (ref 4.0–10.5)
nRBC: 0 % (ref 0.0–0.2)

## 2022-07-09 LAB — TYPE AND SCREEN
ABO/RH(D): AB POS
Antibody Screen: NEGATIVE

## 2022-07-09 NOTE — Progress Notes (Signed)
[place] COMPREHENSIVE PROGRESS NOTE  Stanislawa Gaffin is a 32 y.o. [id number] at [redacted]w[redacted]d who is admitted for PPROM.  Estimated Date of Delivery: 09/19/22 Fetal presentation is cephalic.(1/26)  Length of Stay:  6 Days. Admitted 07/03/2022  Subjective: No complaints this AM Patient reports good fetal movement.  She denies uterine contractions, no bleeding. Occ LOF.   Vitals:  Blood pressure (!) 107/56, pulse 81, temperature 98.9 F (37.2 C), temperature source Oral, resp. rate 17, height 5\' 2"  (1.575 m), weight 61.8 kg, last menstrual period 12/13/2021, SpO2 99 %. Physical Examination: CONSTITUTIONAL: Well-developed, well-nourished female in no acute distress.  NEUROLOGIC: Alert and oriented to person, place, and time. No cranial nerve deficit noted. PSYCHIATRIC: Normal mood and affect. Normal behavior. Normal judgment and thought content. CARDIOVASCULAR: Normal heart rate noted, regular rhythm RESPIRATORY: Effort and breath sounds normal, no problems with respiration noted MUSCULOSKELETAL: Normal range of motion. No edema and no tenderness. 2+ distal pulses. ABDOMEN: Soft, nontender, nondistended, gravid. CERVIX:  Deferred  Fetal monitoring: NST not yet completed this AM. As of yesterday - 140bpm, moderate variability, +accels, no decels. Uterine activity: None  Results for orders placed or performed during the hospital encounter of 07/03/22 (from the past 48 hour(s))  Type and screen [place]     Status: None (Preliminary result)   Collection Time: 07/09/22  5:37 AM  Result Value Ref Range   ABO/RH(D) PENDING    Antibody Screen PENDING    Sample Expiration      07/12/2022,2359 Performed at [facility], [place] [address], [place], [place] [address]   CBC     Status: Abnormal   Collection Time: 07/09/22  5:38 AM  Result Value Ref Range   WBC 11.0 (H) 4.0 - 10.5 K/uL   RBC 3.60 (L) 3.87 - 5.11 MIL/uL   Hemoglobin 10.6 (L) 12.0 - 15.0 g/dL    HCT 31.1 (L) 36.0 - 46.0 %   MCV 86.4 80.0 - 100.0 fL   MCH 29.4 26.0 - 34.0 pg   MCHC 34.1 30.0 - 36.0 g/dL   RDW 11.6 11.5 - 15.5 %   Platelets 286 150 - 400 K/uL   nRBC 0.0 0.0 - 0.2 %    Comment: Performed at [facility], [place] [address] [place], [place] [address]   No results found.  Current scheduled medications  amoxicillin  500 mg Oral TID   docusate sodium  100 mg Oral Daily   prenatal multivitamin  1 tablet Oral Q1200   I have reviewed the patient's current medications.  ASSESSMENT: Principal Problem:   Premature labor with rupture of membranes in third trimester Active Problems:   Cervical incompetence   History of perinatal fetal loss   Supervision of high risk pregnancy, antepartum   Cervical cerclage suture present in second trimester  PLAN: Patient completed magnesium sulfate for neuroprotection Patient received a course of BMZ 1/25-1/26 Continue latency antibiotics Day 7/7 No s/sx of preterm labor or maternal infection. Fetal status is reassuring. Continue daily NST. Will schedule AFI check on 2/2 Continue inpatient observation with plans for delivery at 34 weeks or pending maternal/fetal status. Patient contemplating delaying delivery to 36 weeks.  Patient with history of incompetent cervix with cerclage in situ Plan for glucose challenge test later given recent BMZ administration Continue routine antenatal care.  Gale Journey, MD [place], [facility] for [employer], [place]

## 2022-07-09 NOTE — Telephone Encounter (Signed)
I called patient to check in on her. She is still in L&D. She said they want her to stay there until she delivers at 34 weeks. She reports that she is doing fine.

## 2022-07-10 DIAGNOSIS — O09893 Supervision of other high risk pregnancies, third trimester: Secondary | ICD-10-CM

## 2022-07-10 NOTE — Progress Notes (Signed)
[place] COMPREHENSIVE PROGRESS NOTE  Michelle Miller is a 32 y.o. [id number] at [redacted]w[redacted]d  who is admitted for rupture of membranes.   Fetal presentation is cephalic. Length of Stay:  7  Days  Subjective:  Patient reports good fetal movement.  She reports no uterine contractions, no bleeding and only an occ loss of fluid per vagina.  Vitals:  Blood pressure (!) 104/55, pulse (!) 108, temperature 98 F (36.7 C), temperature source Oral, resp. rate 18, height 5\' 2"  (1.575 m), weight 61.8 kg, last menstrual period 12/13/2021, SpO2 99 %. Physical Examination: Lungs clear Heart RRR Abd soft + BS gravid non tender Ext non tender  Fetal Monitoring:  Baseline: 140's bpm, Variability: Good {> 6 bpm), Accelerations: Reactive, and Decelerations: Absent  Labs:  No results found for this or any previous visit (from the past 24 hour(s)).  Imaging Studies:    NA   Medications:  Scheduled  docusate sodium  100 mg Oral Daily   prenatal multivitamin  1 tablet Oral Q1200   I have reviewed the patient's current medications.  ASSESSMENT: IUP 29 6/7 weeks PPROM Cervical incompetence  PLAN: Stable. S/P Mag, BMX and antibiotics. No S/Sx of infection. Cerclage in place. Limited OB U/S tomorrow Delivery at 34 weeks or for maternal fetal indications. Needs Glucola test next week Continue routine antenatal care.   Chancy Milroy 07/10/2022,1:55 PM

## 2022-07-10 NOTE — Progress Notes (Signed)
Initial Nutrition Assessment  DOCUMENTATION CODES:   Not applicable  INTERVENTION:  Regular Diet Pt may order double protein portions and snacks TID if she makes request when ordering meals   NUTRITION DIAGNOSIS:   Increased nutrient needs related to  (pregnancy and fetal growth requirements) as evidenced by  (29 weeks IUP).  GOAL:   Patient will meet greater than or equal to 90% of their needs, Weight gain  MONITOR:  Weight trends  REASON FOR ASSESSMENT:   Antenatal, LOS   ASSESSMENT:  Now 29 6/7 weeks IUP, adm w/ PPROM. Weight at 11 weeks 57.6 KG, BMI 23. Weight gain of 4.2 kg. on prenatal Vits. Delayed GTT due to [place]. Delivery at 34 weeks planned   Diet Order:   Diet Order             Diet regular Room service appropriate? Yes; Fluid consistency: Thin  Diet effective now                   EDUCATION NEEDS:   No education needs have been identified at this time  Skin:  Skin Assessment: Reviewed RN Assessment  Height:   Ht Readings from Last 1 Encounters:  07/03/22 5\' 2"  (1.575 m)    Weight:   Wt Readings from Last 1 Encounters:  07/03/22 61.8 kg    Ideal Body Weight:   110 lbs  BMI:  Body mass index is 24.91 kg/m.  Estimated Nutritional Needs:   Kcal:  2100-2300  Protein:  93-103 g  Fluid:  > 2.1 L

## 2022-07-11 ENCOUNTER — Inpatient Hospital Stay (HOSPITAL_BASED_OUTPATIENT_CLINIC_OR_DEPARTMENT_OTHER): Payer: BC Managed Care – PPO

## 2022-07-11 DIAGNOSIS — Z3A30 30 weeks gestation of pregnancy: Secondary | ICD-10-CM

## 2022-07-11 DIAGNOSIS — O09293 Supervision of pregnancy with other poor reproductive or obstetric history, third trimester: Secondary | ICD-10-CM | POA: Diagnosis not present

## 2022-07-11 DIAGNOSIS — N898 Other specified noninflammatory disorders of vagina: Secondary | ICD-10-CM

## 2022-07-11 DIAGNOSIS — O42913 Preterm premature rupture of membranes, unspecified as to length of time between rupture and onset of labor, third trimester: Secondary | ICD-10-CM | POA: Diagnosis not present

## 2022-07-11 DIAGNOSIS — O3432 Maternal care for cervical incompetence, second trimester: Secondary | ICD-10-CM | POA: Diagnosis not present

## 2022-07-11 DIAGNOSIS — O26893 Other specified pregnancy related conditions, third trimester: Secondary | ICD-10-CM

## 2022-07-11 NOTE — Progress Notes (Signed)
[place] COMPREHENSIVE PROGRESS NOTE  Michelle Miller is a 32 y.o. [id number] at [redacted]w[redacted]d  who is admitted for premature prelabor rupture of membranes.   Fetal presentation is cephalic. Length of Stay:  8  Days  Subjective: Had more fluid overnight. Otherwise doing well. Denies pain, ctx, VB. Excellent fetal movement.  Vitals:  Blood pressure 98/60, pulse 76, temperature (!) 97.4 F (36.3 C), temperature source Axillary, resp. rate 18, height 5\' 2"  (1.575 m), weight 61.8 kg, last menstrual period 12/13/2021, SpO2 100 %. Physical Examination: Lungs normal respiratory effort Heart regular rate Abd soft + BS gravid non tender Ext non tender  Fetal Monitoring:  Baseline: 140's bpm, Variability: Moderate, Accelerations: Reactive, and Decelerations: Absent  Labs:  No results found for this or any previous visit (from the past 24 hour(s)).  Imaging Studies:    NA   Medications:  Scheduled  docusate sodium  100 mg Oral Daily   prenatal multivitamin  1 tablet Oral Q1200   I have reviewed the patient's current medications.  ASSESSMENT: IUP 29 6/7 weeks PPROM Cervical incompetence  PLAN: Stable. S/P Mag, BMX and antibiotics. No S/Sx of infection. Cerclage in place. BPP today Delivery at 34 weeks or for maternal fetal indications. Needs Glucola test next week Continue routine antenatal care.   Michelle Miller 07/11/2022,7:55 AM

## 2022-07-11 NOTE — Plan of Care (Signed)
  Problem: Education: Goal: Knowledge of General Education information will improve Description: Including pain rating scale, medication(s)/side effects and non-pharmacologic comfort measures Outcome: Completed/Met   Problem: Activity: Goal: Risk for activity intolerance will decrease Outcome: Completed/Met   Problem: Nutrition: Goal: Adequate nutrition will be maintained Outcome: Completed/Met   Problem: Coping: Goal: Level of anxiety will decrease Outcome: Completed/Met   Problem: Elimination: Goal: Will not experience complications related to bowel motility Outcome: Completed/Met Goal: Will not experience complications related to urinary retention Outcome: Completed/Met   Problem: Education: Goal: Knowledge of disease or condition will improve Outcome: Completed/Met Goal: Knowledge of the prescribed therapeutic regimen will improve Outcome: Completed/Met

## 2022-07-12 DIAGNOSIS — Z3A30 30 weeks gestation of pregnancy: Secondary | ICD-10-CM

## 2022-07-12 LAB — CBC
HCT: 34.4 % — ABNORMAL LOW (ref 36.0–46.0)
Hemoglobin: 11.3 g/dL — ABNORMAL LOW (ref 12.0–15.0)
MCH: 29 pg (ref 26.0–34.0)
MCHC: 32.8 g/dL (ref 30.0–36.0)
MCV: 88.2 fL (ref 80.0–100.0)
Platelets: 293 10*3/uL (ref 150–400)
RBC: 3.9 MIL/uL (ref 3.87–5.11)
RDW: 11.9 % (ref 11.5–15.5)
WBC: 9.8 10*3/uL (ref 4.0–10.5)
nRBC: 0 % (ref 0.0–0.2)

## 2022-07-12 LAB — TYPE AND SCREEN
ABO/RH(D): AB POS
Antibody Screen: NEGATIVE

## 2022-07-12 MED ORDER — ORAL CARE MOUTH RINSE: 15.0000 mL | OROMUCOSAL | Status: DC | PRN

## 2022-07-12 NOTE — Plan of Care (Signed)
  Problem: Health Behavior/Discharge Planning: Goal: Ability to manage health-related needs will improve Outcome: Progressing   Problem: Clinical Measurements: Goal: Ability to maintain clinical measurements within normal limits will improve Outcome: Progressing Goal: Will remain free from infection Outcome: Progressing Goal: Diagnostic test results will improve Outcome: Progressing Goal: Respiratory complications will improve Outcome: Progressing Goal: Cardiovascular complication will be avoided Outcome: Progressing   Problem: Pain Managment: Goal: General experience of comfort will improve Outcome: Progressing   Problem: Safety: Goal: Ability to remain free from injury will improve Outcome: Progressing   Problem: Skin Integrity: Goal: Risk for impaired skin integrity will decrease Outcome: Progressing   Problem: Education: Goal: Individualized Educational Video(s) Outcome: Progressing   Problem: Clinical Measurements: Goal: Complications related to the disease process, condition or treatment will be avoided or minimized Outcome: Progressing

## 2022-07-12 NOTE — Progress Notes (Signed)
[place] COMPREHENSIVE PROGRESS NOTE  Michelle Miller is a 32 y.o. [id number] at [redacted]w[redacted]d  who is admitted for rupture of membranes.   Fetal presentation is cephalic. Length of Stay:  9  Days  Subjective: Pt without complaints this morning. Patient reports good fetal movement.  She reports no uterine contractions, no bleeding and occ loss of fluid per vagina.  Vitals:  Blood pressure 107/66, pulse 93, temperature 98.2 F (36.8 C), temperature source Oral, resp. rate 16, height 5\' 2"  (1.575 m), weight 61.8 kg, last menstrual period 12/13/2021, SpO2 99 %.  Physical Examination: Lungs clear Heart RRR Abd soft + BS gravid non tender GU deferred Ext non tender  Fetal Monitoring:  Baseline: 140's bpm, Variability: Good {> 6 bpm), Accelerations: Reactive, and Decelerations: Absent  Labs:  Results for orders placed or performed during the hospital encounter of 07/03/22 (from the past 24 hour(s))  Type and screen [place]   Collection Time: 07/12/22  4:29 AM  Result Value Ref Range   ABO/RH(D) AB POS    Antibody Screen NEG    Sample Expiration      07/15/2022,2359 Performed at [facility] [address], [place], [place] [address]   CBC   Collection Time: 07/12/22  6:05 AM  Result Value Ref Range   WBC 9.8 4.0 - 10.5 K/uL   RBC 3.90 3.87 - 5.11 MIL/uL   Hemoglobin 11.3 (L) 12.0 - 15.0 g/dL   HCT 34.4 (L) 36.0 - 46.0 %   MCV 88.2 80.0 - 100.0 fL   MCH 29.0 26.0 - 34.0 pg   MCHC 32.8 30.0 - 36.0 g/dL   RDW 11.9 11.5 - 15.5 %   Platelets 293 150 - 400 K/uL   nRBC 0.0 0.0 - 0.2 %    Imaging Studies:    NA   Medications:  Scheduled  docusate sodium  100 mg Oral Daily   prenatal multivitamin  1 tablet Oral Q1200   I have reviewed the patient's current medications.  ASSESSMENT: IUP 30 weeks PPROM Cervical incompetence   PLAN: Stable. S/P Magnesium,BMZ and antibiotics. No S/Sx of infection. Fetal well being reassuring.   Continue  routine antenatal care.   Chancy Milroy 07/12/2022,10:52 AM

## 2022-07-13 LAB — GLUCOSE, RANDOM: Glucose, Bld: 193 mg/dL — ABNORMAL HIGH (ref 70–99)

## 2022-07-13 LAB — GLUCOSE, FASTING GESTATIONAL: Glucose Tolerance, Fasting: 91 mg/dL

## 2022-07-13 MED ORDER — ZOLPIDEM TARTRATE 5 MG PO TABS: 5.0000 mg | ORAL_TABLET | Freq: Every evening | ORAL | Status: DC | PRN

## 2022-07-13 NOTE — Plan of Care (Signed)
  Problem: Health Behavior/Discharge Planning: Goal: Ability to manage health-related needs will improve Outcome: Progressing   Problem: Clinical Measurements: Goal: Ability to maintain clinical measurements within normal limits will improve Outcome: Progressing Goal: Will remain free from infection Outcome: Progressing Goal: Diagnostic test results will improve Outcome: Progressing Goal: Respiratory complications will improve Outcome: Progressing Goal: Cardiovascular complication will be avoided Outcome: Progressing   Problem: Pain Managment: Goal: General experience of comfort will improve Outcome: Progressing   Problem: Safety: Goal: Ability to remain free from injury will improve Outcome: Progressing   Problem: Skin Integrity: Goal: Risk for impaired skin integrity will decrease Outcome: Progressing   Problem: Education: Goal: Individualized Educational Video(s) Outcome: Progressing   Problem: Clinical Measurements: Goal: Complications related to the disease process, condition or treatment will be avoided or minimized Outcome: Progressing   

## 2022-07-13 NOTE — Progress Notes (Signed)
[place] COMPREHENSIVE PROGRESS NOTE  Michelle Miller is a 32 y.o. [id number] at [redacted]w[redacted]d  who is admitted for PROM.   Fetal presentation is cephalic. Length of Stay:  10  Days  Subjective: Pt has no complaints this morning Patient reports good fetal movement.  She reports no uterine contractions, no bleeding and occ loss of fluid per vagina.  Vitals:  Blood pressure (!) 112/59, pulse (!) 113, temperature 98.4 F (36.9 C), temperature source Oral, resp. rate 18, height 5\' 2"  (1.575 m), weight 61.8 kg, last menstrual period 12/13/2021, SpO2 99 %. Physical Examination: Lungs clear Heart RRR Abd soft + BS gravid non tender Ext non tender  Fetal Monitoring:  Baseline: 130 bpm, Variability: Good {> 6 bpm), Accelerations: Reactive, and Decelerations: Absent  Labs:  Results for orders placed or performed during the hospital encounter of 07/03/22 (from the past 24 hour(s))  Glucose, fasting gestational   Collection Time: 07/13/22  5:33 AM  Result Value Ref Range   Glucose Tolerance, Fasting 91 mg/dL  Glucose, random   Collection Time: 07/13/22  6:57 AM  Result Value Ref Range   Glucose, Bld 193 (H) 70 - 99 mg/dL    Imaging Studies:    NA   Medications:  Scheduled  docusate sodium  100 mg Oral Daily   prenatal multivitamin  1 tablet Oral Q1200   I have reviewed the patient's current medications.  ASSESSMENT: IUP 30 1/7 weeks PPROM Cervical incompetence, cerclage in place  PLAN: Stable. No S/Sx of infection. Fetal well being reassuring. Pt needs 2 hr Glucola next week Continue routine antenatal care.   Chancy Milroy 07/13/2022,10:39 AM

## 2022-07-14 NOTE — Progress Notes (Signed)
[facility] [place] COMPREHENSIVE PROGRESS NOTE  Michelle Miller is a 32 y.o. [id number] at [redacted]w[redacted]d  who is admitted for PPROM, has cerclage in place.  Fetal presentation is cephalic. Length of Stay:  11  Days  Subjective: No complaints this morning. Patient reports good fetal movement.  She reports no uterine contractions, no bleeding and occ loss of fluid per vagina.  Vitals:  Blood pressure (!) 92/57, pulse 95, temperature 98 F (36.7 C), temperature source Oral, resp. rate 16, height 5\' 2"  (1.575 m), weight 61.8 kg, last menstrual period 12/13/2021, SpO2 99 %. Physical Examination: CONSTITUTIONAL: Well-developed, well-nourished female in no acute distress.  NEUROLOGIC: Alert and oriented to person, place, and time. No cranial nerve deficit noted. PSYCHIATRIC: Normal mood and affect. Normal behavior. Normal judgment and thought content. CARDIOVASCULAR: Normal heart rate noted, regular rhythm RESPIRATORY: Effort and breath sounds normal, no problems with respiration noted MUSCULOSKELETAL: Normal range of motion. No edema and no tenderness. 2+ distal pulses. ABDOMEN: Soft, nontender, nondistended, gravid. CERVIX:  Deferred  Fetal Monitoring:  Baseline: 130 bpm, Variability: Good {> 6 bpm), Accelerations: Reactive, and Decelerations: Absent  Labs:  No results found for this or any previous visit (from the past 24 hour(s)).   Imaging Studies:    NA   Medications:  Scheduled  docusate sodium  100 mg Oral Daily   prenatal multivitamin  1 tablet Oral Q1200   I have reviewed the patient's current medications.  ASSESSMENT: PPROM Cervical incompetence, cerclage in place  PLAN: Stable. No S/Sx of infection. Fetal well being reassuring. Pt needs 2 hr Glucola this week. Continue routine antenatal care.   [name] 07/14/2022,9:41 AM

## 2022-07-15 LAB — CBC
HCT: 33.8 % — ABNORMAL LOW (ref 36.0–46.0)
Hemoglobin: 11.5 g/dL — ABNORMAL LOW (ref 12.0–15.0)
MCH: 29.6 pg (ref 26.0–34.0)
MCHC: 34 g/dL (ref 30.0–36.0)
MCV: 86.9 fL (ref 80.0–100.0)
Platelets: 248 10*3/uL (ref 150–400)
RBC: 3.89 MIL/uL (ref 3.87–5.11)
RDW: 12 % (ref 11.5–15.5)
WBC: 9 10*3/uL (ref 4.0–10.5)
nRBC: 0 % (ref 0.0–0.2)

## 2022-07-15 LAB — TYPE AND SCREEN
ABO/RH(D): AB POS
Antibody Screen: NEGATIVE

## 2022-07-15 NOTE — Progress Notes (Signed)
[place] COMPREHENSIVE PROGRESS NOTE  Michelle Miller is a 32 y.o. [id number] at [redacted]w[redacted]d  who is admitted for PPROM, has cerclage in place.  Fetal presentation is cephalic. Length of Stay:  12  Days  Subjective: No complaints this morning. Patient reports good fetal movement.  She reports no uterine contractions, no bleeding and occ loss of fluid per vagina.  Vitals:  Blood pressure 111/68, pulse 97, temperature 98.4 F (36.9 C), temperature source Oral, resp. rate 20, height 5\' 2"  (1.575 m), weight 61.8 kg, last menstrual period 12/13/2021, SpO2 99 %. Physical Examination: CONSTITUTIONAL: Well-developed, well-nourished female in no acute distress.  NEUROLOGIC: Alert and oriented to person, place, and time. No cranial nerve deficit noted. PSYCHIATRIC: Normal mood and affect. Normal behavior. Normal judgment and thought content. CARDIOVASCULAR: Normal heart rate noted, regular rhythm RESPIRATORY: Effort and breath sounds normal, no problems with respiration noted MUSCULOSKELETAL: Normal range of motion. No edema and no tenderness. 2+ distal pulses. ABDOMEN: Soft, nontender, nondistended, gravid. CERVIX:  Deferred  Fetal Monitoring:  Baseline: 140 bpm, Variability: Moderate {6-25 bpm), Accelerations: Reactive, and Decelerations: Absent  Labs:  Results for orders placed or performed during the hospital encounter of 07/03/22 (from the past 24 hour(s))  Type and screen   Collection Time: 07/15/22  7:22 AM  Result Value Ref Range   ABO/RH(D) AB POS    Antibody Screen NEG    Sample Expiration      07/18/2022,2359 Performed at [facility] [address] [place], [place] [address]   CBC   Collection Time: 07/15/22  7:23 AM  Result Value Ref Range   WBC 9.0 4.0 - 10.5 K/uL   RBC 3.89 3.87 - 5.11 MIL/uL   Hemoglobin 11.5 (L) 12.0 - 15.0 g/dL   HCT 33.8 (L) 36.0 - 46.0 %   MCV 86.9 80.0 - 100.0 fL   MCH 29.6 26.0 - 34.0 pg   MCHC 34.0 30.0 - 36.0 g/dL   RDW 12.0  11.5 - 15.5 %   Platelets 248 150 - 400 K/uL   nRBC 0.0 0.0 - 0.2 %     Imaging Studies:    NA   Medications:  Scheduled  docusate sodium  100 mg Oral Daily   prenatal multivitamin  1 tablet Oral Q1200   I have reviewed the patient's current medications.  ASSESSMENT: PPROM Cervical incompetence, cerclage in place  PLAN: Stable. No S/Sx of infection. Fetal well being reassuring. Pt needs 2 hr Glucola this week, scheduled for 07/18/22.  Continue routine antenatal care.   Chistine Dematteo 07/15/2022,9:06 AM

## 2022-07-16 MED ORDER — SODIUM CHLORIDE 0.9% FLUSH
3.0000 mL | Freq: Two times a day (BID) | INTRAVENOUS | Status: DC
  Administered 2022-07-17 – 2022-07-19 (×5): 3 mL via INTRAVENOUS

## 2022-07-16 NOTE — Progress Notes (Signed)
[place] COMPREHENSIVE PROGRESS NOTE  Michelle Miller is a 32 y.o. [id number] at [redacted]w[redacted]d  who is admitted for PPROM, has cerclage in place.  Fetal presentation is cephalic. Length of Stay:  13  Days  Subjective: No complaints this morning. No changes from yesterday. Patient reports good fetal movement.  She reports no uterine contractions, no bleeding and occ loss of fluid per vagina.  Vitals:  Blood pressure 107/65, pulse 87, temperature 98.2 F (36.8 C), temperature source Oral, resp. rate 18, height 5\' 2"  (1.575 m), weight 61.8 kg, last menstrual period 12/13/2021, SpO2 99 %. Physical Examination: CONSTITUTIONAL: Well-developed, well-nourished female in no acute distress.  NEUROLOGIC: Alert and oriented to person, place, and time. No cranial nerve deficit noted. PSYCHIATRIC: Normal mood and affect. Normal behavior. Normal judgment and thought content. CARDIOVASCULAR: Normal heart rate noted, regular rhythm RESPIRATORY: Effort and breath sounds normal, no problems with respiration noted MUSCULOSKELETAL: Normal range of motion. No edema and no tenderness. 2+ distal pulses. ABDOMEN: Soft, nontender, nondistended, gravid. CERVIX:  Deferred  Fetal Monitoring:  Baseline: 130 bpm, Variability: Moderate {6-25 bpm), Accelerations: Reactive, and Decelerations: Absent  Labs:     Latest Ref Rng & Units 07/15/2022    7:23 AM 07/12/2022    6:05 AM 07/09/2022    5:38 AM  CBC  WBC 4.0 - 10.5 K/uL 9.0  9.8  11.0   Hemoglobin 12.0 - 15.0 g/dL 11.5  11.3  10.6   Hematocrit 36.0 - 46.0 % 33.8  34.4  31.1   Platelets 150 - 400 K/uL 248  293  286     Imaging Studies:    NA   Medications:  Scheduled  docusate sodium  100 mg Oral Daily   prenatal multivitamin  1 tablet Oral Q1200   I have reviewed the patient's current medications.  ASSESSMENT: PPROM Cervical incompetence, cerclage in place  PLAN: Stable. No S/Sx of infection. Fetal well being reassuring. Pt needs 2 hr Glucola  this week, scheduled for 07/18/22.  Also scheduled for AFI on 07/18/22, gets this every Friday. Continue routine antenatal care.   Emmette Katt 07/16/2022,9:03 AM

## 2022-07-17 NOTE — Progress Notes (Signed)
[place] COMPREHENSIVE PROGRESS NOTE  Michelle Miller is a 32 y.o. [id number] at [redacted]w[redacted]d  who is admitted for PPROM, has cerclage in place.  Fetal presentation is cephalic. Length of Stay:  14  Days  Subjective: No complaints this morning. No changes from yesterday. Patient reports good fetal movement.  She reports no uterine contractions, no bleeding and occ loss of fluid per vagina but it remains clear.  Vitals:  Blood pressure 109/65, pulse 99, temperature 98.7 F (37.1 C), temperature source Oral, resp. rate 18, height 5\' 2"  (1.575 m), weight 61.8 kg, last menstrual period 12/13/2021, SpO2 100 %. Physical Examination: CONSTITUTIONAL: Well-developed, well-nourished female in no acute distress.  NEUROLOGIC: Alert and oriented to person, place, and time. No cranial nerve deficit noted. PSYCHIATRIC: Normal mood and affect. Normal behavior. Normal judgment and thought content. CARDIOVASCULAR: Normal heart rate noted, regular rhythm RESPIRATORY: Effort and breath sounds normal, no problems with respiration noted MUSCULOSKELETAL: Normal range of motion. No edema and no tenderness. 2+ distal pulses. ABDOMEN: Soft, nontender, nondistended, gravid. CERVIX:  Deferred  Fetal Monitoring:  Baseline: 140 bpm, Variability: Moderate {6-25 bpm), Accelerations: Reactive, and Decelerations: Absent Toco: Irritable  Labs:     Latest Ref Rng & Units 07/15/2022    7:23 AM 07/12/2022    6:05 AM 07/09/2022    5:38 AM  CBC  WBC 4.0 - 10.5 K/uL 9.0  9.8  11.0   Hemoglobin 12.0 - 15.0 g/dL 11.5  11.3  10.6   Hematocrit 36.0 - 46.0 % 33.8  34.4  31.1   Platelets 150 - 400 K/uL 248  293  286     Imaging Studies:    NA   Medications:  Scheduled  docusate sodium  100 mg Oral Daily   prenatal multivitamin  1 tablet Oral Q1200   sodium chloride flush  3 mL Intravenous Q12H   I have reviewed the patient's current medications.  ASSESSMENT: PPROM Cervical incompetence, cerclage in  place  PLAN: #PPROM - Stable. No S/Sx of infection. Fetal well being reassuring.  - Scheduled for AFI on 07/18/22, gets this every Friday. - Next growth is 2/21 - Continue NST BID - CBC Q72h  #Routine PNC - Pt needs 2 hr Glucola this week, scheduled for tomorrow along with routine 28w labs (RPR, HIV).  - Declines TDAP  #Disposition - Inpatient until delivery. Patient would like, if possible, to be able to go to [facility] if she makes it to 32 weeks as this would be closer to home for her after delivery I.e. NICU. Reviewed we can review next week once closer to 32 weeks which would be the soonest this would be an option    [employer] 07/17/2022,11:17 AM

## 2022-07-18 ENCOUNTER — Inpatient Hospital Stay (HOSPITAL_BASED_OUTPATIENT_CLINIC_OR_DEPARTMENT_OTHER): Payer: BC Managed Care – PPO

## 2022-07-18 DIAGNOSIS — Z3A31 31 weeks gestation of pregnancy: Secondary | ICD-10-CM

## 2022-07-18 DIAGNOSIS — O09293 Supervision of pregnancy with other poor reproductive or obstetric history, third trimester: Secondary | ICD-10-CM

## 2022-07-18 DIAGNOSIS — O3433 Maternal care for cervical incompetence, third trimester: Secondary | ICD-10-CM | POA: Diagnosis not present

## 2022-07-18 DIAGNOSIS — O42913 Preterm premature rupture of membranes, unspecified as to length of time between rupture and onset of labor, third trimester: Secondary | ICD-10-CM

## 2022-07-18 LAB — TYPE AND SCREEN
ABO/RH(D): AB POS
Antibody Screen: NEGATIVE

## 2022-07-18 LAB — CBC
HCT: 31.4 % — ABNORMAL LOW (ref 36.0–46.0)
Hemoglobin: 10.6 g/dL — ABNORMAL LOW (ref 12.0–15.0)
MCH: 29.3 pg (ref 26.0–34.0)
MCHC: 33.8 g/dL (ref 30.0–36.0)
MCV: 86.7 fL (ref 80.0–100.0)
Platelets: 229 10*3/uL (ref 150–400)
RBC: 3.62 MIL/uL — ABNORMAL LOW (ref 3.87–5.11)
RDW: 12.3 % (ref 11.5–15.5)
WBC: 11.2 10*3/uL — ABNORMAL HIGH (ref 4.0–10.5)
nRBC: 0 % (ref 0.0–0.2)

## 2022-07-18 LAB — HIV ANTIBODY (ROUTINE TESTING W REFLEX): HIV Screen 4th Generation wRfx: NONREACTIVE

## 2022-07-18 LAB — GLUCOSE, 2 HOUR GESTATIONAL: Glucose Tolerance, 2 hour: 142 mg/dL

## 2022-07-18 LAB — RPR: RPR Ser Ql: NONREACTIVE

## 2022-07-18 LAB — GLUCOSE, FASTING GESTATIONAL: Glucose Tolerance, Fasting: 88 mg/dL

## 2022-07-18 NOTE — Progress Notes (Signed)
[name] COMPREHENSIVE PROGRESS NOTE  Michelle Miller is a 32 y.o. [id number] at [redacted]w[redacted]d who is admitted for PPROM, has cerclage in place.  Fetal presentation is cephalic. Length of Stay:  15  Days  Subjective: No complaints this morning. No changes from yesterday. Patient reports good fetal movement.  She reports no uterine contractions, no bleeding and occ loss of fluid per vagina but it remains clear.  Vitals:  Blood pressure (!) 106/56, pulse (!) 103, temperature 98.2 F (36.8 C), temperature source Oral, resp. rate 18, height 5' 2"$  (1.575 m), weight 61.8 kg, last menstrual period 12/13/2021, SpO2 100 %. Physical Examination: CONSTITUTIONAL: Well-developed, well-nourished female in no acute distress.  NEUROLOGIC: Alert and oriented to person, place, and time. No cranial nerve deficit noted. PSYCHIATRIC: Normal mood and affect. Normal behavior. Normal judgment and thought content. CARDIOVASCULAR: Normal heart rate noted, regular rhythm RESPIRATORY: Effort and breath sounds normal, no problems with respiration noted MUSCULOSKELETAL: Normal range of motion. No edema and no tenderness. 2+ distal pulses. ABDOMEN: Soft, nontender, nondistended, gravid. CERVIX:  Deferred  Fetal Monitoring:  Baseline: 130 bpm, Variability: Moderate {6-25 bpm), Accelerations: Reactive, and Decelerations: Absent Toco: Flat  Labs:     Latest Ref Rng & Units 07/18/2022    5:54 AM 07/15/2022    7:23 AM 07/12/2022    6:05 AM  CBC  WBC 4.0 - 10.5 K/uL 11.2  9.0  9.8   Hemoglobin 12.0 - 15.0 g/dL 10.6  11.5  11.3   Hematocrit 36.0 - 46.0 % 31.4  33.8  34.4   Platelets 150 - 400 K/uL 229  248  293     Imaging Studies:    NA   Medications:  Scheduled  docusate sodium  100 mg Oral Daily   prenatal multivitamin  1 tablet Oral Q1200   sodium chloride flush  3 mL Intravenous Q12H   I have reviewed the patient's current medications.  ASSESSMENT: PPROM Cervical incompetence, cerclage in  place  PLAN: #PPROM - Stable. No S/Sx of infection. Fetal well being reassuring.  - Scheduled for AFI on 07/18/22, gets this every Friday. Final report pending.  - Next growth is 2/21 - Continue NST BID - CBC Q72h  #Routine PNC - 2 not drawn correctly - missing the 1 hr draw. However, her fasting was 88 and her 2 hr was 142 both of which are normal. Will review with patient. I am comfortable with calling her results normal and not requiring her to recheck the test. Her other option would be a nonfasting 1 hr gtt.  - Declines TDAP  #Disposition - Inpatient until delivery. [facility], [facility] as this would be closer to home for her after delivery I.e. NICU. Reviewed we can review next week once closer to 32 weeks which would be the soonest this would be an option    Michelle Gunning2/02/2023,1:11 PM

## 2022-07-19 ENCOUNTER — Inpatient Hospital Stay (HOSPITAL_COMMUNITY): Payer: BC Managed Care – PPO | Admitting: Anesthesiology

## 2022-07-19 ENCOUNTER — Encounter (HOSPITAL_COMMUNITY): Payer: Self-pay | Admitting: Obstetrics and Gynecology

## 2022-07-19 LAB — CBC
HCT: 30.9 % — ABNORMAL LOW (ref 36.0–46.0)
Hemoglobin: 10.4 g/dL — ABNORMAL LOW (ref 12.0–15.0)
MCH: 29.4 pg (ref 26.0–34.0)
MCHC: 33.7 g/dL (ref 30.0–36.0)
MCV: 87.3 fL (ref 80.0–100.0)
Platelets: 229 10*3/uL (ref 150–400)
RBC: 3.54 MIL/uL — ABNORMAL LOW (ref 3.87–5.11)
RDW: 12.2 % (ref 11.5–15.5)
WBC: 13.5 10*3/uL — ABNORMAL HIGH (ref 4.0–10.5)
nRBC: 0 % (ref 0.0–0.2)

## 2022-07-19 MED ORDER — FENTANYL CITRATE (PF) 100 MCG/2ML IJ SOLN: 100.0000 ug | INTRAMUSCULAR | Status: DC | PRN

## 2022-07-19 MED ORDER — PHENYLEPHRINE 80 MCG/ML (10ML) SYRINGE FOR IV PUSH (FOR BLOOD PRESSURE SUPPORT)
80.0000 ug | PREFILLED_SYRINGE | INTRAVENOUS | Status: DC | PRN
  Filled 2022-07-19: qty 10

## 2022-07-19 MED ORDER — FENTANYL CITRATE (PF) 100 MCG/2ML IJ SOLN
INTRAMUSCULAR | Status: AC
  Administered 2022-07-19: 100 ug via INTRAVENOUS
  Filled 2022-07-19: qty 2

## 2022-07-19 MED ORDER — LIDOCAINE HCL (PF) 1 % IJ SOLN
INTRAMUSCULAR | Status: DC | PRN
  Administered 2022-07-19: 3 mL via EPIDURAL
  Administered 2022-07-19: 5 mL via EPIDURAL

## 2022-07-19 MED ORDER — LACTATED RINGERS IV BOLUS
500.0000 mL | Freq: Once | INTRAVENOUS | Status: AC
  Administered 2022-07-19: 500 mL via INTRAVENOUS

## 2022-07-19 MED ORDER — EPHEDRINE 5 MG/ML INJ
10.0000 mg | INTRAVENOUS | Status: DC | PRN
  Filled 2022-07-19: qty 5

## 2022-07-19 MED ORDER — PHENYLEPHRINE 80 MCG/ML (10ML) SYRINGE FOR IV PUSH (FOR BLOOD PRESSURE SUPPORT): 80.0000 ug | PREFILLED_SYRINGE | INTRAVENOUS | Status: DC | PRN

## 2022-07-19 MED ORDER — MAGNESIUM SULFATE 40 GM/1000ML IV SOLN
INTRAVENOUS | Status: AC
  Administered 2022-07-19: 2 g/h via INTRAVENOUS
  Filled 2022-07-19: qty 1000

## 2022-07-19 MED ORDER — LACTATED RINGERS IV SOLN
500.0000 mL | Freq: Once | INTRAVENOUS | Status: AC
  Administered 2022-07-19: 500 mL via INTRAVENOUS

## 2022-07-19 MED ORDER — FENTANYL-BUPIVACAINE-NACL 0.5-0.125-0.9 MG/250ML-% EP SOLN
12.0000 mL/h | EPIDURAL | Status: DC | PRN
  Administered 2022-07-19: 11.5 mL/h via EPIDURAL
  Filled 2022-07-19: qty 250

## 2022-07-19 MED ORDER — MAGNESIUM SULFATE 40 GM/1000ML IV SOLN: 2.0000 g/h | INTRAVENOUS | Status: DC

## 2022-07-19 MED ORDER — DIPHENHYDRAMINE HCL 50 MG/ML IJ SOLN: 12.5000 mg | INTRAMUSCULAR | Status: DC | PRN

## 2022-07-19 MED ORDER — EPHEDRINE 5 MG/ML INJ
10.0000 mg | INTRAVENOUS | Status: DC | PRN
  Administered 2022-07-19: 10 mg via INTRAVENOUS

## 2022-07-19 MED ORDER — LACTATED RINGERS IV SOLN: INTRAVENOUS | Status: DC

## 2022-07-19 NOTE — Progress Notes (Signed)
Michelle Miller is a 32 y.o. [id number] at 37w1dby LMP admitted for Preterm labor, PROM  Subjective: Comfortable with epidural in place   Objective: BP 116/68 (BP Location: Right Arm)   Pulse 94   Temp 98.7 F (37.1 C) (Oral)   Resp 17   Ht 5' 2"$  (1.575 m)   Wt 61.8 kg   LMP 12/13/2021 (Exact Date)   SpO2 100%   BMI 24.91 kg/m  No intake/output data recorded. Total I/O In: 881.8 [I.V.:881.8] Out: 571[Urine:50]  FHT:  140 reactive UC: regular, every 2-3 minutes SVE:   Dilation: 4 Effacement (%): 80 Station: 0 Exam by:: Dr. ARoselie Awkward Labs: Lab Results  Component Value Date   WBC 13.5 (H) 07/19/2022   HGB 10.4 (L) 07/19/2022   HCT 30.9 (L) 07/19/2022   MCV 87.3 07/19/2022   PLT 229 07/19/2022    Assessment / Plan: Preterm labor with PPROM 374w1dagnesium for neuroprotection Labor:  preterm labor Preeclampsia:  no signs or symptoms of toxicity Fetal Wellbeing:  Category I Pain Control:  IV pain meds I/D:   GBS negative Anticipated MOD:  NSVD  JoConcepcion LivingMD 07/19/2022, 10:33 PM

## 2022-07-19 NOTE — Progress Notes (Signed)
Patient ID: Michelle Miller, female   DOB: [birth year], 30 y.o.   MRN: [id number] [place]) NOTE  Diamonte Toms is a 32 y.o. [id number] with Estimated Date of Delivery: 09/19/22   By  early ultrasound [redacted]w[redacted]d who is admitted for PPROM.    Fetal presentation is cephalic. Length of Stay:  16  Days  Date of admission:07/03/2022  Subjective: No complaints Patient reports the fetal movement as active. Patient reports uterine contraction  activity as none. Patient reports  vaginal bleeding as none. Patient describes fluid per vagina as None.  Vitals:  Blood pressure (!) 99/58, pulse 99, temperature 98 F (36.7 C), temperature source Oral, resp. rate 18, height 5' 2"$  (1.575 m), weight 61.8 kg, last menstrual period 12/13/2021, SpO2 99 %. Vitals:   07/18/22 1204 07/18/22 1614 07/18/22 1910 07/19/22 0637  BP: (!) 106/56 107/69 (!) 116/58 (!) 99/58  Pulse: (!) 103 86 92 99  Resp: 18 17 18 18  $ Temp: 98.2 F (36.8 C) 98.4 F (36.9 C) 97.7 F (36.5 C) 98 F (36.7 C)  TempSrc: Oral Oral Oral Oral  SpO2: 100% 99% 100% 99%  Weight:      Height:       Physical Examination:  General appearance - alert, well appearing, and in no distress Abdomen - soft, nontender, nondistended, no masses or organomegaly Fundal Height:  size equals dates Pelvic Exam:  examination not indicated Cervical Exam: Not evaluated.  Extremities: extremities normal, atraumatic, no cyanosis or edema with DTRs 2+ bilaterally Membranes:ruptured, clear fluid  Fetal Monitoring:  Baseline: 130s bpm, Variability: Good {> 6 bpm), Accelerations: Reactive, and Decelerations: Absent   reactive  Labs:  Results for orders placed or performed during the hospital encounter of 07/03/22 (from the past 24 hour(s))  Glucose, fasting gestational   Collection Time: 07/18/22  7:57 AM  Result Value Ref Range   Glucose Tolerance, Fasting 88 mg/dL  Glucose, 2 hour gestational   Collection Time: 07/18/22 11:04 AM   Result Value Ref Range   Glucose Tolerance, 2 hour 142 mg/dL    Imaging Studies:    No results found.   Medications:  Scheduled  docusate sodium  100 mg Oral Daily   prenatal multivitamin  1 tablet Oral Q1200   sodium chloride flush  3 mL Intravenous Q12H   I have reviewed the patient's current medications.  ASSESSMENT: [id number] Date of Delivery: 09/19/22  Patient Active Problem List   Diagnosis Date Noted   Premature labor with rupture of membranes in third trimester 07/03/2022   Cervical cerclage suture present in second trimester 04/10/2022   Supervision of high risk pregnancy, antepartum 01/31/2022   History of perinatal fetal loss 10/29/2021   Cervical incompetence 09/16/2021    PLAN: >S/P BMZ x 2 and latency antibiotics >Cephalic with cerclage in place >continue in house expectant management  [facility]  LuYahoo/03/2023,7:35 AM

## 2022-07-19 NOTE — Progress Notes (Signed)
Epidural Clean: 2024 Drape:2024 Injection: 2025 Start: 2025 First Dose: 2026 Catheter: 2026 Dressing: 2027

## 2022-07-19 NOTE — Anesthesia Preprocedure Evaluation (Addendum)
Anesthesia Evaluation  Patient identified by MRN, date of birth, ID band Patient awake    Reviewed: Allergy & Precautions, Patient's Chart, lab work & pertinent test results  Airway Mallampati: II       Dental no notable dental hx.    Pulmonary neg pulmonary ROS   Pulmonary exam normal breath sounds clear to auscultation       Cardiovascular negative cardio ROS Normal cardiovascular exam     Neuro/Psych negative neurological ROS  negative psych ROS   GI/Hepatic negative GI ROS, Neg liver ROS,,,  Endo/Other  negative endocrine ROS    Renal/GU negative Renal ROS  negative genitourinary   Musculoskeletal negative musculoskeletal ROS (+)    Abdominal Normal abdominal exam  (+)   Peds  Hematology  (+) Blood dyscrasia, anemia   Anesthesia Other Findings   Reproductive/Obstetrics (+) Pregnancy Hx/o perinatal fetal loss PTL 31 1/7 weeks                             Anesthesia Physical Anesthesia Plan  ASA: 2  Anesthesia Plan: Epidural   Post-op Pain Management:    Induction:   PONV Risk Score and Plan:   Airway Management Planned: Natural Airway  Additional Equipment:   Intra-op Plan:   Post-operative Plan:   Informed Consent: I have reviewed the patients History and Physical, chart, labs and discussed the procedure including the risks, benefits and alternatives for the proposed anesthesia with the patient or authorized representative who has indicated his/her understanding and acceptance.       Plan Discussed with: Anesthesiologist  Anesthesia Plan Comments:         Anesthesia Quick Evaluation

## 2022-07-19 NOTE — OR Nursing (Signed)
NICU charge nurse made aware of 31+1 week pt with possible delivery tonight.  SBAR given. NICU charge will speak with the NICU team in regards to setting the panda warmer up for delivery.

## 2022-07-19 NOTE — Anesthesia Procedure Notes (Signed)
Epidural Patient location during procedure: OB Start time: 07/19/2022 8:23 PM End time: 07/19/2022 8:31 PM  Staffing Anesthesiologist: Josephine Igo, MD Performed: anesthesiologist   Preanesthetic Checklist Completed: patient identified, IV checked, site marked, risks and benefits discussed, surgical consent, monitors and equipment checked, pre-op evaluation and timeout performed  Epidural Patient position: sitting Prep: DuraPrep and site prepped and draped Patient monitoring: continuous pulse ox and blood pressure Approach: midline Location: L3-L4 Injection technique: LOR air  Needle:  Needle type: Tuohy  Needle gauge: 17 G Needle length: 9 cm and 9 Needle insertion depth: 4 cm Catheter type: closed end flexible Catheter size: 19 Gauge Catheter at skin depth: 9 cm Test dose: negative and Other  Assessment Events: blood not aspirated, no cerebrospinal fluid, injection not painful, no injection resistance, no paresthesia and negative IV test  Additional Notes Patient identified. Risks and benefits discussed including failed block, incomplete  Pain control, post dural puncture headache, nerve damage, paralysis, blood pressure Changes, nausea, vomiting, reactions to medications-both toxic and allergic and post Partum back pain. All questions were answered. Patient expressed understanding and wished to proceed. Sterile technique was used throughout procedure. Epidural site was Dressed with sterile barrier dressing. No paresthesias, signs of intravascular injection Or signs of intrathecal spread were encountered.  Patient was more comfortable after the epidural was dosed. Please see RN's note for documentation of vital signs and FHR which are stable.

## 2022-07-19 NOTE — Progress Notes (Signed)
Michelle Miller is a 32 y.o. [id number] at 67w1dby LMP admitted for Preterm labor, PROM  Subjective:contractions, bleeding leaking AF   Objective: BP 109/63 (BP Location: Right Arm)   Pulse 94   Temp 98.5 F (36.9 C) (Oral)   Resp 16   Ht 5' 2"$  (1.575 m)   Wt 61.8 kg   LMP 12/13/2021 (Exact Date)   SpO2 99%   BMI 24.91 kg/m  No intake/output data recorded. No intake/output data recorded.  FHT:  150 reactive UC:   irregular, every 2-3 minutes SVE:    80/4/0/v Cerclage intact and tight, visualized with speculum  and cut and removed with scissors Labs: Lab Results  Component Value Date   WBC 11.2 (H) 07/18/2022   HGB 10.6 (L) 07/18/2022   HCT 31.4 (L) 07/18/2022   MCV 86.7 07/18/2022   PLT 229 07/18/2022    Assessment / Plan: Preterm labor with PPROM 321w1dagnesium for neuroprotection Labor:  preterm labor Preeclampsia:  no signs or symptoms of toxicity Fetal Wellbeing:  Category I Pain Control:  IV pain meds I/D:   GBS negative Anticipated MOD:  NSVD  JaEmeterio ReeveMD 07/19/2022, 7:06 PM

## 2022-07-19 NOTE — Progress Notes (Signed)
Called to patient's room after taking her off EFM around 1655. States contractions have been coming every 3 minutes or so. She now has a fifty-cent sized spot on her pad that appears to be blood mixed with amniotic fluid. Dr. Roselie Awkward called to evaluate patient and requested that she be moved to labor and delivery for closer monitoring

## 2022-07-20 ENCOUNTER — Encounter (HOSPITAL_COMMUNITY): Payer: Self-pay | Admitting: Obstetrics and Gynecology

## 2022-07-20 DIAGNOSIS — O3433 Maternal care for cervical incompetence, third trimester: Secondary | ICD-10-CM

## 2022-07-20 DIAGNOSIS — O09893 Supervision of other high risk pregnancies, third trimester: Secondary | ICD-10-CM

## 2022-07-20 DIAGNOSIS — O42013 Preterm premature rupture of membranes, onset of labor within 24 hours of rupture, third trimester: Secondary | ICD-10-CM

## 2022-07-20 DIAGNOSIS — Z3A31 31 weeks gestation of pregnancy: Secondary | ICD-10-CM

## 2022-07-20 MED ORDER — BENZOCAINE-MENTHOL 20-0.5 % EX AERO
1.0000 | INHALATION_SPRAY | CUTANEOUS | Status: DC | PRN
  Administered 2022-07-20: 1 via TOPICAL
  Filled 2022-07-20: qty 56

## 2022-07-20 MED ORDER — WITCH HAZEL-GLYCERIN EX PADS: 1.0000 | MEDICATED_PAD | CUTANEOUS | Status: DC | PRN

## 2022-07-20 MED ORDER — MISOPROSTOL 200 MCG PO TABS
ORAL_TABLET | ORAL | Status: AC
  Filled 2022-07-20: qty 4

## 2022-07-20 MED ORDER — TETANUS-DIPHTH-ACELL PERTUSSIS 5-2.5-18.5 LF-MCG/0.5 IM SUSY: 0.5000 mL | PREFILLED_SYRINGE | Freq: Once | INTRAMUSCULAR | Status: DC

## 2022-07-20 MED ORDER — PRENATAL MULTIVITAMIN CH
1.0000 | ORAL_TABLET | Freq: Every day | ORAL | Status: DC
  Administered 2022-07-20: 1 via ORAL
  Filled 2022-07-20: qty 1

## 2022-07-20 MED ORDER — ONDANSETRON HCL 4 MG PO TABS: 4.0000 mg | ORAL_TABLET | ORAL | Status: DC | PRN

## 2022-07-20 MED ORDER — DIBUCAINE (PERIANAL) 1 % EX OINT: 1.0000 | TOPICAL_OINTMENT | CUTANEOUS | Status: DC | PRN

## 2022-07-20 MED ORDER — TRANEXAMIC ACID-NACL 1000-0.7 MG/100ML-% IV SOLN
INTRAVENOUS | Status: AC
  Administered 2022-07-20: 1000 mg
  Filled 2022-07-20: qty 100

## 2022-07-20 MED ORDER — SENNOSIDES-DOCUSATE SODIUM 8.6-50 MG PO TABS
2.0000 | ORAL_TABLET | ORAL | Status: DC
  Administered 2022-07-20: 2 via ORAL
  Filled 2022-07-20: qty 2

## 2022-07-20 MED ORDER — ACETAMINOPHEN 325 MG PO TABS: 650.0000 mg | ORAL_TABLET | ORAL | Status: DC | PRN

## 2022-07-20 MED ORDER — METHYLERGONOVINE MALEATE 0.2 MG/ML IJ SOLN
INTRAMUSCULAR | Status: AC
  Administered 2022-07-20: 0.2 mg
  Filled 2022-07-20: qty 1

## 2022-07-20 MED ORDER — ONDANSETRON HCL 4 MG/2ML IJ SOLN: 4.0000 mg | INTRAMUSCULAR | Status: DC | PRN

## 2022-07-20 MED ORDER — METHYLERGONOVINE MALEATE 0.2 MG/ML IJ SOLN: 0.2000 mg | INTRAMUSCULAR | Status: DC | PRN

## 2022-07-20 MED ORDER — OXYTOCIN-SODIUM CHLORIDE 30-0.9 UT/500ML-% IV SOLN
INTRAVENOUS | Status: AC
  Filled 2022-07-20: qty 500

## 2022-07-20 MED ORDER — DIPHENHYDRAMINE HCL 25 MG PO CAPS: 25.0000 mg | ORAL_CAPSULE | Freq: Four times a day (QID) | ORAL | Status: DC | PRN

## 2022-07-20 MED ORDER — IBUPROFEN 600 MG PO TABS
600.0000 mg | ORAL_TABLET | Freq: Four times a day (QID) | ORAL | Status: DC
  Administered 2022-07-20: 600 mg via ORAL
  Filled 2022-07-20 (×3): qty 1

## 2022-07-20 MED ORDER — COCONUT OIL OIL
1.0000 | TOPICAL_OIL | Status: DC | PRN
  Administered 2022-07-20: 1 via TOPICAL

## 2022-07-20 MED ORDER — ZOLPIDEM TARTRATE 5 MG PO TABS: 5.0000 mg | ORAL_TABLET | Freq: Every evening | ORAL | Status: DC | PRN

## 2022-07-20 MED ORDER — METHYLERGONOVINE MALEATE 0.2 MG PO TABS: 0.2000 mg | ORAL_TABLET | ORAL | Status: DC | PRN

## 2022-07-20 MED ORDER — METHYLERGONOVINE MALEATE 0.2 MG PO TABS
0.2000 mg | ORAL_TABLET | ORAL | Status: AC
  Administered 2022-07-20 – 2022-07-21 (×6): 0.2 mg via ORAL
  Filled 2022-07-20 (×6): qty 1

## 2022-07-20 MED ORDER — TRANEXAMIC ACID-NACL 1000-0.7 MG/100ML-% IV SOLN: 1000.0000 mg | INTRAVENOUS | Status: DC

## 2022-07-20 MED ORDER — MISOPROSTOL 200 MCG PO TABS
800.0000 ug | ORAL_TABLET | Freq: Once | ORAL | Status: AC
  Administered 2022-07-20: 800 ug via ORAL

## 2022-07-20 MED ORDER — SIMETHICONE 80 MG PO CHEW: 80.0000 mg | CHEWABLE_TABLET | ORAL | Status: DC | PRN

## 2022-07-20 NOTE — Discharge Summary (Signed)
Postpartum Discharge Summary  Date of Service updated***     Patient Name: Michelle Miller DOB: 1991/02/12 MRN: [id number]  Date of admission: 07/03/2022 Delivery date:07/20/2022  Delivering provider: Concepcion Living  Date of discharge: 07/20/2022  Admitting diagnosis: Premature labor with rupture of membranes in third trimester [O42.913] Intrauterine pregnancy: [redacted]w[redacted]d    Secondary diagnosis:  Principal Problem:   Premature labor with rupture of membranes in third trimester Active Problems:   Cervical incompetence   History of perinatal fetal loss   Supervision of high risk pregnancy, antepartum   Cervical cerclage suture present in second trimester   Preterm delivery  Additional problems: ***    Discharge diagnosis: Preterm Pregnancy Delivered                                              Post partum procedures:{Postpartum procedures:23558} Augmentation:  None Complications: None  Hospital course: Onset of Labor With Vaginal Delivery      32y.o. yo [id number] 387w2das admitted in Latent Labor on 07/03/2022. Labor course was complicated by preterm premature rupture of membranes.  Apparent placental abruption just prior to delivery. Membrane Rupture Time/Date: 10:30 PM ,07/02/2022   Delivery Method:Vaginal, Spontaneous  Episiotomy: None  Lacerations:  None  Patient had a postpartum course complicated by ***.  She is ambulating, tolerating a regular diet, passing flatus, and urinating well. Patient is discharged home in stable condition on 07/20/22.  Newborn Data: Birth date:07/20/2022  Birth time:12:01 AM  Gender:Female  Living status:Living  Apgars:6 ,8  Weight:1850 g   Magnesium Sulfate received: Yes: Neuroprotection BMZ received: Yes Rhophylac:N/A MMR:N/A T-DaP:{Tdap:23962} Flu: No Transfusion:{Transfusion received:30440034}  Physical exam  Vitals:   07/19/22 2300 07/19/22 2330 07/20/22 0030 07/20/22 0045  BP: 114/71 118/71 114/69 127/82  Pulse: (!) 107 (!)  103 (!) 113 (!) 109  Resp: 16 18 18 18  $ Temp:      TempSrc:      SpO2:      Weight:      Height:       General: {Exam; general:21111117} Lochia: {Desc; appropriate/inappropriate:30686::"appropriate"} Uterine Fundus: {Desc; firm/soft:30687} Incision: {Exam; incision:21111123} DVT Evaluation: {Exam; dvt:2111122} Labs: Lab Results  Component Value Date   WBC 13.5 (H) 07/19/2022   HGB 10.4 (L) 07/19/2022   HCT 30.9 (L) 07/19/2022   MCV 87.3 07/19/2022   PLT 229 07/19/2022      Latest Ref Rng & Units 07/18/2022    7:57 AM  CMP  Glucose mg/dL 88    Edinburgh Score:    01/31/2022   10:35 AM  Edinburgh Postnatal Depression Scale Screening Tool  I have been able to laugh and see the funny side of things. 0  I have looked forward with enjoyment to things. 0  I have blamed myself unnecessarily when things went wrong. 0  I have been anxious or worried for no good reason. 0  I have felt scared or panicky for no good reason. 0  Things have been getting on top of me. 1  I have been so unhappy that I have had difficulty sleeping. 0  I have felt sad or miserable. 0  I have been so unhappy that I have been crying. 0  The thought of harming myself has occurred to me. 0  Edinburgh Postnatal Depression Scale Total 1     After visit meds:  Allergies as of 07/20/2022   No Known Allergies   Med Rec must be completed prior to using this [facility]***        Discharge home in stable condition Infant Feeding: {Baby feeding:23562} Infant Disposition:{CHL IP OB HOME WITH [id number] Discharge instruction: per After Visit Summary and Postpartum booklet. Activity: Advance as tolerated. Pelvic rest for 6 weeks.  Diet: {OB TF:[id number] Future Appointments:No future appointments. Follow up Visit: Patient will [facility] at [facility]  Please schedule this patient for a In person postpartum visit in 6 weeks with the following provider: Any provider. Additional Postpartum F/U: N/A    High risk pregnancy complicated by:  Short cervix with cerclage in place Delivery mode:  Vaginal, Spontaneous  Anticipated Birth Control:  Unsure   07/20/2022 Concepcion Living, MD

## 2022-07-20 NOTE — Lactation Note (Signed)
This note was copied from a baby's chart.  NICU Lactation Consultation Note  Patient Name: Boy Deborah Kisner [id number] Date: 07/20/2022 Age:30 hours   Subjective Reason for consult: Initial assessment; NICU baby; Preterm <34wks  LC assisted P2 Mom to pump for the 3rd time using a hand's free pumping band.  Mom encouraged to continue to consistently pump at least every 3 hrs when awake. Reviewed breast massage and hand expression, expressed drops easily.  Encouraged hand expressing after pumping.  Mom aware of lactation support available and encouraged to ask for help prn  Talked about STS and pumping when able to in the NICU.   Prepared 2 bins for washing and drying of pump parts.  Reviewed importance of disassembling pump parts each time and saving EBM in separate bottles, labeled with baby's sticker.  Objective Infant data: Mother's Current Feeding Choice: Breast Milk and Donor Milk  Infant feeding assessment NPO at present   Maternal data: [id number]  Vaginal, Spontaneous Significant Breast History:: Breastfed first baby (4 yrs old) for 9 months  Pumping frequency: Already pumped 2 times, and assisted to pump a 3rd.  Encouraged 8 times per 24 hrs Pumped volume: 15 mL Flange Size: 21  Risk factor for low milk supply:: Infant separation, baby in NICU   Pump: Personal, DEBP (Lansinoh DEBP from insurance)  Assessment Infant:  Maternal: Milk volume: Normal   Intervention/Plan Interventions: Breast feeding basics reviewed; Skin to skin; Breast massage; Hand express; DEBP; Coconut oil; Education; [employer] brochure  Tools: Pump; Flanges; Hands-free pumping top Pump Education: Setup, frequency, and cleaning; Milk Storage  Plan: Consult Status: NICU follow-up  NICU Follow-up type: New admission follow up    [name] 07/20/2022, 10:08 AM

## 2022-07-20 NOTE — Anesthesia Postprocedure Evaluation (Signed)
Anesthesia Post Note  Patient: Michelle Miller  Procedure(s) Performed: AN AD [place]     Patient location during evaluation: Mother Baby Anesthesia Type: Epidural Level of consciousness: awake and alert Pain management: pain level controlled Vital Signs Assessment: post-procedure vital signs reviewed and stable Respiratory status: spontaneous breathing, nonlabored ventilation and respiratory function stable Cardiovascular status: stable Postop Assessment: no headache, no backache, epidural receding, no apparent nausea or vomiting, patient able to bend at knees, able to ambulate and adequate PO intake Anesthetic complications: no   No notable events documented.  Last Vitals:  Vitals:   07/20/22 0447 07/20/22 0811  BP: 105/64 97/70  Pulse: 87 (!) 118  Resp: 16 18  Temp: 36.7 C 36.8 C  SpO2: 100% 99%    Last Pain:  Vitals:   07/20/22 0811  TempSrc: Oral  PainSc:    Pain Goal: Patients Stated Pain Goal: 3 (07/16/22 0850)                 Jabier Mutton

## 2022-07-21 NOTE — Lactation Note (Signed)
This note was copied from a baby's chart.  NICU Lactation Consultation Note  Patient Name: Michelle Miller [id number] Date: 07/21/2022 Age:30 hours   Subjective Reason for consult: Follow-up assessment; Preterm <34wks; NICU baby  [place] visit with P2 Mom of infant in the NICU.  Mom currently eating her breakfast.  She states she has been consistently pumping every 3 hrs and expressing 5 ml the last pumping.  She is taking the EBM to the NICU as baby may start NG feeds today.   Mom denies any questions and appreciates the support.  LC washed the pump parts and placed disassembled in drying bin.  Suggested she take her pump parts up to NICU to pump at the bedside.  Encouraged STS while baby is fed for first time, if able to.  Objective Infant data: Mother's Current Feeding Choice: Breast Milk and Donor Milk  Infant feeding assessment Maternal data: [id number]  Vaginal, Spontaneous Significant Breast History:: Breastfed first baby (82 yrs old) for 9 months  Pumping frequency: 8 times per 24 hrs Pumped volume: 5 mL Flange Size: 21  Risk factor for low milk supply:: Infant separation, baby in NICU   Pump: Personal, DEBP  Assessment Infant: Feeding Status: NPO   Maternal: Milk volume: Normal   Intervention/Plan Interventions: Breast feeding basics reviewed; Skin to skin; Breast massage; Hand express; DEBP; Education  Tools: Pump; Flanges Pump Education: Setup, frequency, and cleaning  Plan: Consult Status: NICU follow-up  NICU Follow-up type: New admission follow up    [name] 07/21/2022, 9:58 AM

## 2022-07-21 NOTE — Clinical Social Work Maternal (Signed)
CLINICAL SOCIAL WORK MATERNAL/CHILD NOTE  Patient Details  Name: Michelle Miller MRN: [id number] Date of Birth: 1990/12/03  Date:  07/21/2022  Clinical Social Worker Initiating Note:  Abundio Miu, [place] Date/Time: Initiated:  07/21/22/1422     Child's Name:  Michelle Miller   Biological Parents:  Mother, Father (Father: Michelle Miller 02/06/94)   Need for Interpreter:  None   Reason for Referral:  Parental Support of Premature Babies < 32 weeks/or Critically Ill babies   Address:  [place] [address]    Phone number:  [phone] (home)     Additional phone number:   Household Members/Support Persons (HM/SP):   Household Member/Support Person 1, Household Member/Support Person 2   HM/SP Name Relationship DOB or Age  HM/SP -[place] son 11/17/17  HM/SP -3        HM/SP -4        HM/SP -5        HM/SP -6        HM/SP -7        HM/SP -8          Natural Supports (not living in the home):  Parent, Other (Comment) (FOB's mom)   Professional Supports: None   Employment: Full-time   Type of Work: Equities trader   Education:  Forensic psychologist   Homebound arranged:    Museum/gallery curator Resources:  Multimedia programmer    Other Resources:  [facility]   Cultural/Religious Considerations Which May Impact Care:    Strengths:  Ability to meet basic needs  , Pediatrician chosen   Psychotropic Medications:         Pediatrician:    Ambulance person List:   [facility] ([facility] ([place]  [facility]      Pediatrician Fax Number:    Risk Factors/Current Problems:  None   Cognitive State:  Alert  , Able to Concentrate  , Goal Oriented  , Insightful  , Linear Thinking     Mood/Affect:  Calm  , Interested  , Comfortable  , Relaxed     CSW Assessment: CSW met with MOB at infant's bedside to complete psychosocial  assessment. MOB was sitting in recliner and engaged in skin to skin with infant. CSW introduced self and explained role. MOB was welcoming, pleasant, and remained engaged during assessment. MOB reported that she resides with her FOB and older son. 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 MOB reported that they have most items needed to care for infant. MOB reported that they have a co-sleeper and have ordered a car seat. CSW inquired about MOB's support system, MOB reported that FOB, FOB's mom and her mom is supports.   CSW inquired about MOB's mental health history. MOB denied any mental health history. MOB denied any history of postpartum depression. CSW inquired about how MOB was feeling emotionally since giving birth, MOB reported that she was feeling fine. MOB presented calm and did not demonstrate any acute mental health signs/symptoms. CSW assessed for safety, MOB denied SI, HI, and domestic violence.   CSW provided education regarding the baby blues period vs. perinatal mood disorders, discussed treatment and gave resources for mental health follow up if concerns arise.  CSW recommends self-evaluation during the postpartum time period using the New Mom Checklist  from Postpartum Progress and encouraged MOB to contact a medical professional if symptoms are noted at any time.    CSW provided review of Sudden Infant Death Syndrome (SIDS) precautions.    CSW and MOB discussed infant's NICU admission. CSW informed MOB about the NICU, what to expect, and supports available while infant is admitted to the NICU. MOB reported that she feels well informed about infant's care. MOB denied any transportation barriers with visiting infant in the NICU. MOB denied any questions/concerns.   CSW identifies no further need for intervention and no barriers to discharge at this time. MOB opted to contact CSW if any needs/concerns arise versus CSW checking in weekly.     CSW  Plan/Description:  Sudden Infant Death Syndrome (SIDS) Education, Perinatal Mood and Anxiety Disorder (PMADs) Education, No Further Intervention Required/No Barriers to Discharge, Other Patient/Family Education    Burnis Medin, LCSW 07/21/2022, 2:30 PM

## 2022-07-21 NOTE — Progress Notes (Signed)
Post Partum Day 1 Subjective: no complaints, up ad lib, voiding, tolerating PO, and + flatus  Objective: Blood pressure (!) 108/59, pulse (!) 102, temperature 97.6 F (36.4 C), temperature source Oral, resp. rate 16, height 5' 2"$  (1.575 m), weight 61.8 kg, last menstrual period 12/13/2021, SpO2 100 %, unknown if currently breastfeeding.  Physical Exam:  General: alert, cooperative, and no distress Lochia: appropriate Uterine Fundus: firm Incision: na DVT Evaluation: No evidence of DVT seen on physical exam.  Recent Labs    07/19/22 1941  HGB 10.4*  HCT 30.9*    Assessment/Plan: Plan for discharge tomorrow and Breastfeeding Baby on pressure support in NICU    LOS: 18 days   Florian Buff, MD 07/21/2022, 7:36 AM

## 2022-07-22 ENCOUNTER — Ambulatory Visit: Payer: Self-pay

## 2022-07-22 LAB — SURGICAL PATHOLOGY

## 2022-07-22 MED ORDER — IBUPROFEN 600 MG PO TABS: 600.0000 mg | ORAL_TABLET | Freq: Four times a day (QID) | ORAL | 0 refills | Status: DC

## 2022-07-22 NOTE — Lactation Note (Signed)
This note was copied from a baby's chart. Lactation Consultation Note  Patient Name: Michelle Miller [id number] Date: 07/22/2022   Age:30 hours  LC attempted to visit with Mom, but she was recently discharged from [facility]. Spoke with NICU RN and asked if lactation could be called when Mom comes in to visit.  [name]  RN IBCLC 07/22/2022, 1:15 PM

## 2022-07-22 NOTE — Plan of Care (Signed)
  Problem: Health Behavior/Discharge Planning: Goal: Ability to manage health-related needs will improve Outcome: Completed/Met   Problem: Clinical Measurements: Goal: Ability to maintain clinical measurements within normal limits will improve Outcome: Completed/Met Goal: Will remain free from infection Outcome: Completed/Met Goal: Diagnostic test results will improve Outcome: Completed/Met   Problem: Skin Integrity: Goal: Risk for impaired skin integrity will decrease Outcome: Completed/Met   Problem: Education: Goal: Knowledge of condition will improve Outcome: Completed/Met Goal: Individualized Educational Video(s) Outcome: Completed/Met Goal: Individualized Newborn Educational Video(s) Outcome: Completed/Met   Problem: Life Cycle: Goal: Chance of risk for complications during the postpartum period will decrease Outcome: Completed/Met

## 2022-07-25 ENCOUNTER — Ambulatory Visit: Payer: Self-pay

## 2022-07-25 NOTE — Lactation Note (Signed)
This note was copied from a baby's chart.  NICU Lactation Consultation Note  Patient Name: Boy Scotti Kiddoo [id number] Date: 07/25/2022 Age:30 days  Subjective Reason for consult: Follow-up assessment; Preterm <34wks; NICU baby  Visited with family of 34 59/59 weeks old AGA NICU female, Ms. Crees is a P2 and reports her milk is in. She has been pumping consistently and her supply continues to increase; praised her for her efforts. She requested another pumping top in size S/M, it was provided. Reviewed pumping schedule, pump settings, benefits of premature milk for NICU infants and anticipatory guidelines.  Objective Infant data: Mother's Current Feeding Choice: Breast Milk and Donor Milk  Maternal data: [id number]  Vaginal, Spontaneous Current breast feeding challenges:: NICU admission Pumping frequency: 7-8 times/24 hours Pumped volume: 60 mL (60-90 ml.) Flange Size: 21 Risk factor for low milk supply:: prematurity, infant separation Pump: Personal (Lansinoh DEBP at home)  Assessment Infant: Feeding Status: -- (Scheduled feedings)  Maternal: Milk volume: Normal No S/S of engorgement at this time  Intervention/Plan Interventions: Breast feeding basics reviewed; DEBP; Education; Skin to skin Tools: Pump; Flanges; Coconut oil; Hands-free pumping top (Size S/M, a second pumping top was given on 07/25/2022) Pump Education: Setup, frequency, and cleaning; Milk Storage  Plan of care: Encouraged to continue pumping every 3 hours, ideally 8 pumping sessions/24 hours She'll switch her pump settings from initiation to expression mode She'll continue doing as much STS care as she can  No other support person at this time. All questions and concerns answered, family to contact [facility] services PRN.  Consult Status: NICU follow-up  NICU Follow-up type: Verify absence of engorgement; Weekly NICU follow up   [place] 07/25/2022, 3:20 PM

## 2022-07-31 ENCOUNTER — Telehealth (HOSPITAL_COMMUNITY): Payer: Self-pay | Admitting: *Deleted

## 2022-07-31 NOTE — Telephone Encounter (Signed)
Left phone voicemail message.  Odis Hollingshead, RN 07-31-2022 at 3:30pm

## 2022-08-02 ENCOUNTER — Ambulatory Visit: Payer: Self-pay

## 2022-08-02 NOTE — Lactation Note (Signed)
This note was copied from a baby's chart.  NICU Lactation Consultation Note  Patient Name: Michelle Miller [id number] Date: 08/02/2022 Age:30 days   Subjective Reason for consult: Follow-up assessment; Preterm <34wks; Infant < 6lbs  LC in to visit with P2 Mom of baby in the NICU.  Baby has been going to a pumped breast when Mom is visiting and baby is showing feeding cues.    Mom has an abundant milk supply.  She is pumping consistently every 3 hrs. LC noted pump parts soaking in bin.  LC reviewed importance of disassembling all the pump parts, washing, rinsing and air drying in separate bin provided.  Encouraged STS with baby as much as possible.  Mom may request lactation assistance at next feeding. Mom understands that this is the first step to breastfeeding.    Mom to pre-pump prior to nuzzling baby at the breast.   Objective Infant data:  Infant feeding assessment Scale for Readiness: 3     Maternal data: [id number]  Vaginal, Spontaneous Pumping frequency: Every 3 hrs Pumped volume: 240 mL Flange Size: 21 Pump: Personal (Lansinoh DEBP at home)  Assessment Infant:  Maternal: Milk volume: Abundant   Intervention/Plan Interventions: Breast feeding basics reviewed; Skin to skin; Breast massage; Hand express; DEBP  Tools: Pump; Flanges Pump Education: Setup, frequency, and cleaning; Milk Storage  Plan: Consult Status: NICU follow-up  NICU Follow-up type: Weekly NICU follow up    [name] 08/02/2022, 2:51 PM

## 2022-08-05 ENCOUNTER — Ambulatory Visit: Payer: Self-pay

## 2022-08-05 NOTE — Lactation Note (Signed)
This note was copied from a baby's chart.  NICU Lactation Consultation Note  Patient Name: Boy Kaylene Shaddock [id number] Date: 08/05/2022 Age:30 wk.o.   Subjective Reason for consult: Follow-up assessment; NICU baby; Preterm <34wks; Infant < 6lbs  LC in to visit with P2 Mom of baby boy.  Mom has an abundant milk supply and is pumping consistently 8 times per 24 hrs.  FOB holding baby currently.  Encouraged STS as much as possible to encouraged feeding cues.  Mom states baby has latched to her breast and "gotten milk" as she saw it around baby's mouth.  Mom always does a full pumping prior to latching.    Praised Mom for her dedication and hard work to provide breast milk for her little boy.  Lactation will follow up as baby matures and can feed at the breast.  Objective Infant data:  Infant feeding assessment Scale for Readiness: 3     Maternal data: [id number]  Vaginal, Spontaneous  Pump: Personal (Lansinoh DEBP at home)  Assessment Infant:  Maternal: Milk volume: Abundant   Intervention/Plan Interventions: Skin to skin; Breast massage; Hand express; DEBP; Education  Pump Education: Setup, frequency, and cleaning; Milk Storage  Plan: Consult Status: NICU follow-up  NICU Follow-up type: Weekly NICU follow up    [name] 08/05/2022, 1:45 PM

## 2022-08-13 ENCOUNTER — Ambulatory Visit: Payer: Self-pay

## 2022-08-13 NOTE — Lactation Note (Signed)
This note was copied from a baby's chart.  NICU Lactation Consultation Note  Patient Name: Michelle Miller [id number] Date: 08/13/2022 Age:30 wk.o.  Subjective Reason for consult: NICU baby; Late-preterm 34-36.6wks; Weekly NICU follow-up; Infant < 6lbs  Visited with family of 30 30/51 weeks old AGA NICU female; Ms. Willmore is a P2 and reports she continues pumping consistently and taking baby to semi-pumped breast on feeding cues; praised her for her efforts. She's been doing some "lick & learn" with baby [name] when she comes to visit, about once/day. Offered latch assistance but she voiced she feels comfortable breastfeeding independently after [place] coached her on latching and positioning. She has d/c using her Lansinoh DEBP at home because she needed a replacement part and purchased a Spectra one is the meantime. She voiced that her Spectra is more efficient than her Lansinoh and will continue to use it at home, she'll use to pump in baby's room as her first choice since that one does the best job at milk removal.  Objective Infant data: Mother's Current Feeding Choice: Breast Milk  Infant feeding assessment Scale for Readiness: 3  Maternal data: [id number]  Vaginal, Spontaneous Pumping frequency: 8 times/24 hours Pumped volume: 150 mL (150-240 ml.) Flange Size: 21 Pump: Personal (Spectra)  Assessment Infant: Feeding Status: IDF-1 (lick and learn along with scheduled feedings)  Maternal: Milk volume: Normal  Intervention/Plan Interventions: Breast feeding basics reviewed; DEBP; Education; Infant Driven Feeding Algorithm education Tools: Pump; Flanges Pump Education: Setup, frequency, and cleaning; Milk Storage  Plan of care: Encouraged to continue pumping every 3 hours, ideally 8 pumping sessions/24 hours. She might go 4-6 hours without pumping at night  She'll continue doing as much STS care as she can and work on pre-feeding activities She'll continue taking baby to a  semi-pumped breast on feeding cues around feeding times   FOB present. All questions and concerns answered, family to contact [facility] services PRN.  Consult Status: NICU follow-up  NICU Follow-up type: Weekly NICU follow up; Assist with IDF-1 (Mother to pre-pump before breastfeeding)   Corrin Hingle S [place] Mcneill 08/13/2022, 2:07 PM

## 2022-08-18 ENCOUNTER — Ambulatory Visit: Payer: Self-pay

## 2022-08-18 NOTE — Lactation Note (Signed)
This note was copied from a baby's chart.  NICU Lactation Consultation Note  Patient Name: Boy Beronica Michaelides [id number] Date: 08/18/2022 Age:30 wk.o.  Reason for consult: Follow-up assessment; Mother's request; Late-preterm 34-36.6wks; NICU baby; Infant < 6lbs  Subjective Visited with family of 13 18/69 weeks old AGA NICU female; Ms. Earles called for assistance because the pump in baby's room wasn't working. This [place] troubleshoot the pump, noticed that the cord on the back wasn't plugged in. Turn the pump back on and it worked, Ms. Brouwer was very appreciative. She also reported that pumping is going well and her volumes remain strong.   Objective Infant data: Mother's Current Feeding Choice: Breast Milk  Infant feeding assessment Scale for Readiness: 5 (intermittent tachypnea w/ paci)  Maternal data: [id number]  Vaginal, Spontaneous Pumping frequency: 7-8 times/24 hours Pumped volume: 150 mL (150-240 ml) Flange Size: 21 Pump: Personal (Spectra)  Assessment Infant: Feeding Status: Scheduled 9-12-3-6  Maternal: Milk volume: Normal  Intervention/Plan Interventions: DEBP Tools: Pump; Flanges Pump Education: Setup, frequency, and cleaning; Milk Storage  Plan of care Encouraged to continue pumping every 3 hours, ideally 8 pumping sessions/24 hours. She might go 4-6 hours without pumping at night  She'll continue doing as much STS care as she can and work on pre-feeding activities She'll continue taking baby to a semi-pumped breast on feeding cues around feeding times   No other support person at this time. All questions and concerns answered, family to contact [facility] services PRN.  Consult Status: NICU follow-up  NICU Follow-up type: Weekly NICU follow up; Assist with IDF-1 (Mother to pre-pump before breastfeeding)   Milady Fleener S Kwali Wrinkle 08/18/2022, 11:02 AM

## 2022-08-20 ENCOUNTER — Ambulatory Visit: Payer: Self-pay

## 2022-08-20 NOTE — Lactation Note (Signed)
This note was copied from a baby's chart.  NICU Lactation Consultation Note  Patient Name: Michelle Miller [id number] Date: 08/20/2022 Age:30 wk.o.  Reason for consult: Follow-up assessment; RN request; Mother's request; NICU baby; Late-preterm 34-36.6wks; Infant < 6lbs   Subjective  Called by SLP to assist/assess baby at the breast.  Mom states she has been putting baby on the breast for a minute or two when she is here.  Baby has been exclusively NG fed EBM.    Mom pre-pumped 180 ml prior to starting.    Baby positioned in football hold on left breast.  Baby alert and showing feeding cues.  LC readjusted the pillows for baby to be supported at breast height and Mom's back away from back of recliner.  Mom supporting baby's head and supporting her breast firmly back on base of breast.  Baby latched deeply after a couple attempts.  LC gently tugged on chin to flange lower lip.  Baby sucked consistently with deep jaw extensions and swallows identified.  Mom taught to gently compress breast when baby is sucking.  One episode of loss of suction and LC assisted Mom to bring baby in closer and it stopped.  Baby sucked consistently for 9 mins.  LC saw pump parts in sink.  LC disassembled parts, washed, rinsed and placed in separate bin to dry.    Mom encouraged to continue pre-pumping before breastfeeding, and continue her consistent pumping to support a full milk supply.  Objective Infant data:  Infant feeding assessment Scale for Readiness: 2     Maternal data: [id number]  Vaginal, Spontaneous Pumping frequency: Every 3 hrs Pumped volume: 180 mL Flange Size: 21   Pump: Personal (Spectra)  Assessment Infant: LATCH Documentation Latch: 2 (after a couple attempts) Audible Swallowing: 2 Type of Nipple: 2 Comfort (Breast/Nipple): 2 Hold (Positioning): 1 LATCH Score: 9   Feeding Status: IDF-1   Maternal: Milk volume: Abundant   Intervention/Plan Interventions: Breast  feeding basics reviewed; Assisted with latch; Skin to skin; Breast massage; Hand express; Breast compression; Adjust position; Support pillows; Position options; DEBP  Tools: Pump; Flanges Pump Education: Setup, frequency, and cleaning  Plan: Consult Status: NICU follow-up  NICU Follow-up type: Assist with IDF-1 (Mother to pre-pump before breastfeeding)  No data recorded   Michelle Miller 08/20/2022, 12:21 PM

## 2022-08-23 ENCOUNTER — Ambulatory Visit (HOSPITAL_COMMUNITY): Payer: Self-pay

## 2022-08-23 NOTE — Lactation Note (Signed)
This note was copied from a baby's chart.  NICU Lactation Consultation Note  Patient Name: Michelle Miller [id number] Date: 08/23/2022 Age:30 wk.o.  Reason for consult: Follow-up assessment; Late-preterm 34-36.6wks; NICU baby   Subjective  LC in to visit with P2 Mom of 36wk baby in the NICU.  Baby is now 6 lbs 4.7 oz and on scheduled feedings of breast and gavage feedings of EBM over 30 mins.  Mom has an abundant milk supply.    Baby just fed at the breast for <10 mins with RN assisting.  Mom states baby wasn't consistent like he had been 3 days ago.  Mom has agreed to try paced bottle feeding at next feeding.  Reassured Mom that LPTI are inconsistent feeders at the breast.  Encouraged continued STS and offering the breast with feeding cues.  Mom encouraged to continue her consistent pumping.  She has been pumping every 2 hrs to store milk for later use.  Talked about the importance of rest.  LC recommended pumping 8-10 times per 24 hrs with a 4 hr stretch at night.  Mom will have her baby's RN call LC if she comes tomorrow for another breastfeeding assist/assessment. Objective Infant data: No data recorded Infant feeding assessment Scale for Readiness: 3 Scale for Quality: 3  Maternal data: [id number]  Vaginal, Spontaneous Pumping frequency: every 2-3 hrs Pumped volume: 180 mL  Feeding Status: Scheduled 9-12-3-6 Maternal: Milk volume: Abundant Intervention/Plan Interventions: Breast feeding basics reviewed; Skin to skin; Breast massage; Hand express; DEBP Tools: Pump; Flanges  Plan: Consult Status: NICU follow-up  NICU Follow-up type: Weekly NICU follow up  Michelle Miller 08/23/2022, 3:48 PM

## 2022-08-31 ENCOUNTER — Ambulatory Visit (HOSPITAL_COMMUNITY): Payer: Self-pay

## 2022-08-31 NOTE — Lactation Note (Signed)
This note was copied from a baby'Miller chart.  NICU Lactation Consultation Note  Patient Name: Michelle Miller [id number] Date: 08/31/2022 Age:30 wk.o.  Reason for consult: Weekly NICU follow-up; NICU baby; Early term 37-38.6wks; Other (Comment) (62 discharge)  Subjective  Visited with family of 25 44/62 weeks old AGA NICU female; Michelle Miller is a P2 and reports she'Miller been putting baby to breast consistently the last 72 hours and that she hasn't been pumping much. Explained the importance of consistent pumping after feedings/attempts at the breast to protect her supply. She also endorses that she'Miller no longer waking up in the middle of the night to pump, but that once baby gets home, it might change, she'Miller also happy to report that unlike with her first child, this time she'll be working from home and spending more time with baby. Michelle Miller is taking baby Michelle Miller home today. Reviewed discharge education, pumping schedule and anticipatory guidelines. She politely declined a referral to [facility] OP but she said she'll contact us if any issues arise. All questions and concerns answered, family aware of [facility] services and will contact PRN.  Objective Infant data: Mother'Miller Current Feeding Choice: Breast Milk  Infant feeding assessment Scale for Readiness: 1 Scale for Quality: 1  Maternal data: [id number]  Vaginal, Spontaneous Pumping frequency: twice/24 hours; but she'Miller also been putting baby to breast Pumped volume: 120 mL (120-180 ml) Flange Size: 21 Pump: Personal (Spectra)  Assessment Infant: LATCH Documentation Latch: 2 Audible Swallowing: 2 Type of Nipple: 2 Comfort (Breast/Nipple): 2 Hold (Positioning): 2 LATCH Score: 10  Feeding Status: Ad lib  Maternal: Milk volume: Normal  Intervention/Plan Interventions: Interventions: Breast feeding basics reviewed; DEBP; Education  Discharge Education: Outpatient recommendation  Tools: Pump; Flanges Pump Education: Setup, frequency, and  cleaning; Milk Storage  Plan: Consult Status: Complete   Michelle Miller Michelle Miller 08/31/2022, 2:54 PM

## 2022-09-01 ENCOUNTER — Encounter: Payer: Self-pay | Admitting: Obstetrics

## 2022-09-01 ENCOUNTER — Ambulatory Visit (INDEPENDENT_AMBULATORY_CARE_PROVIDER_SITE_OTHER): Payer: BC Managed Care – PPO | Admitting: Obstetrics

## 2022-09-01 DIAGNOSIS — Z30013 Encounter for initial prescription of injectable contraceptive: Secondary | ICD-10-CM

## 2022-09-01 DIAGNOSIS — Z3202 Encounter for pregnancy test, result negative: Secondary | ICD-10-CM

## 2022-09-01 LAB — POCT URINE PREGNANCY: Preg Test, Ur: NEGATIVE

## 2022-09-01 MED ORDER — MEDROXYPROGESTERONE ACETATE 150 MG/ML IM SUSP
150.0000 mg | Freq: Once | INTRAMUSCULAR | Status: AC
  Administered 2022-09-01: 150 mg via INTRAMUSCULAR

## 2022-09-01 MED ORDER — MEDROXYPROGESTERONE ACETATE 150 MG/ML IM SUSY: 150.0000 mg | PREFILLED_SYRINGE | Freq: Once | INTRAMUSCULAR | Status: DC

## 2022-09-01 NOTE — Patient Instructions (Signed)

## 2022-09-01 NOTE — Addendum Note (Signed)
Addended by: Minette Headland on: 09/01/2022 01:19 PM   Modules accepted: Orders

## 2022-09-01 NOTE — Progress Notes (Addendum)
   OBSTETRICS POSTPARTUM CLINIC PROGRESS NOTE  Subjective:     Michelle Miller is a 32 y.o. [phone] female who presents for a postpartum visit. She is 6 weeks postpartum following a spontaneous vaginal delivery. I have fully reviewed the prenatal and intrapartum course. The delivery was at 31.2 gestational weeks.  Anesthesia: epidural. Postpartum course has been well. Baby's course has been well. Baby is feeding by breast. Bleeding: patient has not not resumed menses, with Patient's last menstrual period was 12/13/2021 (exact date).. Bowel function is normal. Bladder function is normal. Patient is not sexually active. Contraception method desired is Depo-Provera injections. Postpartum depression screening: negative.  EDPS score is 2.    The following portions of the patient's history were reviewed and updated as appropriate: allergies, current medications, past family history, past medical history, past social history, past surgical history, and problem list.  Review of Systems Respiratory: negative Cardiovascular: negative Gastrointestinal: negative Genitourinary:negative Integument/breast: negative Neurological: negative Behavioral/Psych: negative   Objective:    BP 109/69   Pulse 93   Wt 127 lb (57.6 kg)   LMP 12/13/2021 (Exact Date)   Breastfeeding Yes   BMI 23.23 kg/m   General:  alert and no distress   Breasts:  inspection negative, no nipple discharge or bleeding, no masses or nodularity palpable. She is lactating. No nipple irritation or redness noted, nonengorged.  Lungs: clear to auscultation bilaterally  Heart:  regular rate and rhythm, S1, S2 normal, no murmur, click, rub or gallop  Abdomen: soft, non-tender; bowel sounds normal; no masses,  no organomegaly.     Vulva:  normal  Vagina: normal vagina, no discharge, exudate, lesion, or erythema  Cervix:  no cervical motion tenderness and no lesions  Corpus: normal size, contour, position, consistency, mobility, non-tender   Adnexa:  normal adnexa and no mass, fullness, tenderness  Rectal Exam: Not performed.         Labs:  Lab Results  Component Value Date   HGB 10.4 (L) 07/19/2022     Assessment:   No diagnosis found.   Plan:    1. Contraception: Depo-Provera injections 2. Will check Hgb for h/o postpartum anemia of less than 10.  3. Follow up in:  3-4  months or annual exam   Imagene Riches, CNM  09/01/2022 11:36 AM   [facility]

## 2022-11-25 ENCOUNTER — Ambulatory Visit (INDEPENDENT_AMBULATORY_CARE_PROVIDER_SITE_OTHER): Payer: BC Managed Care – PPO

## 2022-11-25 VITALS — BP 105/68 | HR 71 | Wt 123.6 lb

## 2022-11-25 DIAGNOSIS — Z3042 Encounter for surveillance of injectable contraceptive: Secondary | ICD-10-CM | POA: Diagnosis not present

## 2022-11-25 MED ORDER — MEDROXYPROGESTERONE ACETATE 150 MG/ML IM SUSP
150.0000 mg | Freq: Once | INTRAMUSCULAR | Status: AC
  Administered 2022-11-25: 150 mg via INTRAMUSCULAR

## 2022-11-25 NOTE — Progress Notes (Signed)
    NURSE VISIT NOTE  Subjective:    Patient ID: Michelle Miller, female    DOB: [birth year], 32 y.o.   MRN: [id number]  HPI  Patient is a 32 y.o. [phone] female who presents for depo provera injection.   Objective:    BP 105/68   Pulse 71   Wt 123 lb 9.6 oz (56.1 kg)   BMI 22.61 kg/m   Last Annual: 03/06/21. Last pap: 02/28/22. Last Depo-Provera: 09/01/2022. Side Effects if any: none. Serum HCG indicated? No . Depo-Provera 150 mg IM given by: Sheliah Hatch, CMA. Site: Left Deltoid  Lab Review    Assessment:   1. Encounter for surveillance of injectable contraceptive      Plan:   Next appointment due between 9/3 and 02/24/2023.    Fonda Kinder, CMA

## 2023-02-17 ENCOUNTER — Ambulatory Visit: Payer: BC Managed Care – PPO

## 2023-02-18 ENCOUNTER — Ambulatory Visit (INDEPENDENT_AMBULATORY_CARE_PROVIDER_SITE_OTHER): Payer: BC Managed Care – PPO

## 2023-02-18 VITALS — BP 100/60 | HR 70 | Ht 61.0 in | Wt 128.0 lb

## 2023-02-18 DIAGNOSIS — Z3042 Encounter for surveillance of injectable contraceptive: Secondary | ICD-10-CM | POA: Diagnosis not present

## 2023-02-18 MED ORDER — MEDROXYPROGESTERONE ACETATE 150 MG/ML IM SUSY
150.0000 mg | PREFILLED_SYRINGE | Freq: Once | INTRAMUSCULAR | Status: AC
  Administered 2023-02-18: 150 mg via INTRAMUSCULAR

## 2023-02-18 NOTE — Patient Instructions (Signed)

## 2023-02-18 NOTE — Progress Notes (Signed)
    NURSE VISIT NOTE  Subjective:    Patient ID: Michelle Miller, female    DOB: 1991-02-27, 32 y.o.   MRN: [id number]  HPI  Patient is a 32 y.o. [phone] female who presents for depo provera injection.   Objective:    BP 100/60   Pulse 70   Ht 5\' 1"  (1.549 m)   Wt 128 lb (58.1 kg)   Breastfeeding No   BMI 24.19 kg/m   Last Annual: 09/01/22. Last pap: 02/28/22. Last Depo-Provera: 11/25/22. Side Effects if any: none. Serum HCG indicated? No . Depo-Provera 150 mg IM given by: Donnetta Hail, CMA. Site: Right Deltoid    Assessment:   1. Encounter for surveillance of injectable contraceptive      Plan:   Next appointment due between Nov 27 and Dec 11.    Donnetta Hail, CMA

## 2023-05-13 ENCOUNTER — Ambulatory Visit (INDEPENDENT_AMBULATORY_CARE_PROVIDER_SITE_OTHER): Payer: Self-pay

## 2023-05-13 ENCOUNTER — Ambulatory Visit: Payer: BC Managed Care – PPO

## 2023-05-13 VITALS — BP 119/75 | HR 93 | Ht 61.0 in | Wt 137.9 lb

## 2023-05-13 DIAGNOSIS — Z3042 Encounter for surveillance of injectable contraceptive: Secondary | ICD-10-CM | POA: Insufficient documentation

## 2023-05-13 MED ORDER — MEDROXYPROGESTERONE ACETATE 150 MG/ML IM SUSP
150.0000 mg | Freq: Once | INTRAMUSCULAR | Status: AC
  Administered 2023-05-13: 150 mg via INTRAMUSCULAR

## 2023-05-13 NOTE — Patient Instructions (Signed)

## 2023-05-13 NOTE — Progress Notes (Signed)
    NURSE VISIT NOTE  Subjective:    Patient ID: Michelle Miller, female    DOB: 1990/06/27, 32 y.o.   MRN: [id number]  HPI  Patient is a 32 y.o. [phone] female who presents for depo provera injection.   Objective:    BP 119/75   Pulse 93   Ht 5\' 1"  (1.549 m)   Wt 137 lb 14.4 oz (62.6 kg)   BMI 26.06 kg/m   Last Annual: 09/01/22. Last pap: 02/28/22. Last Depo-Provera: 02/18/23. Side Effects if any: none. Serum HCG indicated? No . Depo-Provera 150 mg IM given by: Rocco Serene, LPN. Site: Left Deltoid  Lab Review    Assessment:   1. Encounter for surveillance of injectable contraceptive      Plan:   Next appointment due between 07/29/23 and 08/12/23.    Rocco Serene, LPN

## 2023-07-28 IMAGING — US US OB < 14 WEEKS - US OB TV
1 series · 14 of 28 positions shown · non-contrast
Comparison: None.
COMPARISON: None.

Addendum:
CLINICAL DATA: Missed., vaginal bleeding

EXAM:
OBSTETRIC <14 WK ULTRASOUND
TECHNIQUE: Transabdominal ultrasound was performed for evaluation of the
gestation as well as the maternal uterus and adnexal regions.
TECHNIQUE: Transabdominal and transvaginal ultrasound was performed for
evaluation of the gestation as well as the maternal uterus,
endometrium, ovaries and adnexal regions.
*** End of Addendum ***

[Series 1: us ob < 14 weeks - us ob tv · 91 acquisitions, 14 frames shown]
[im 4/91]
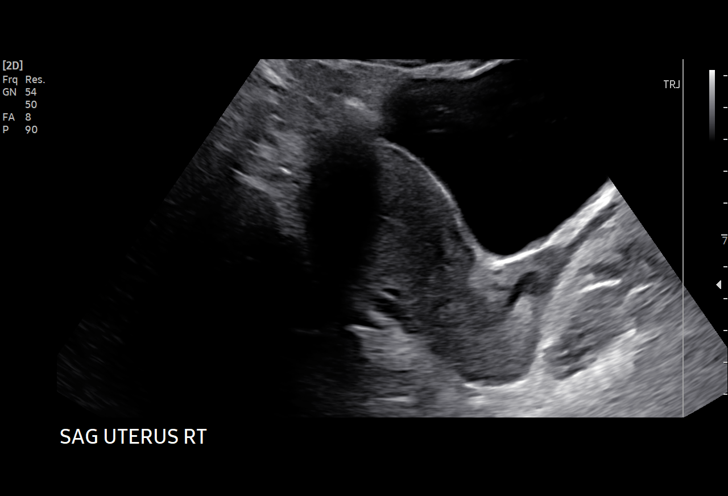
[im 11/91]
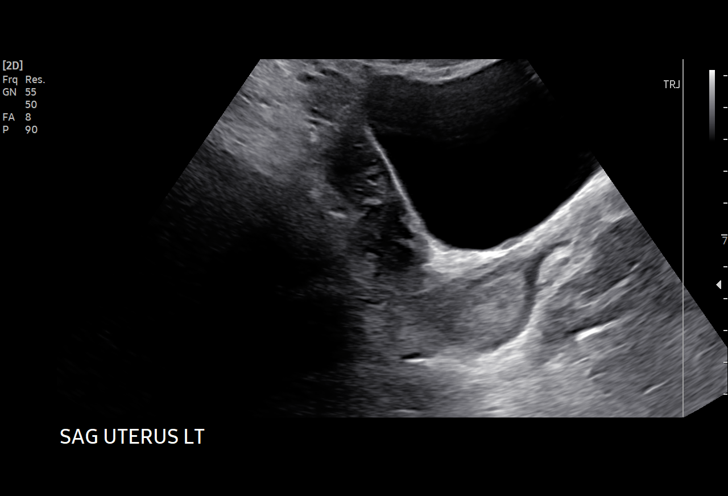
[im 17/91]
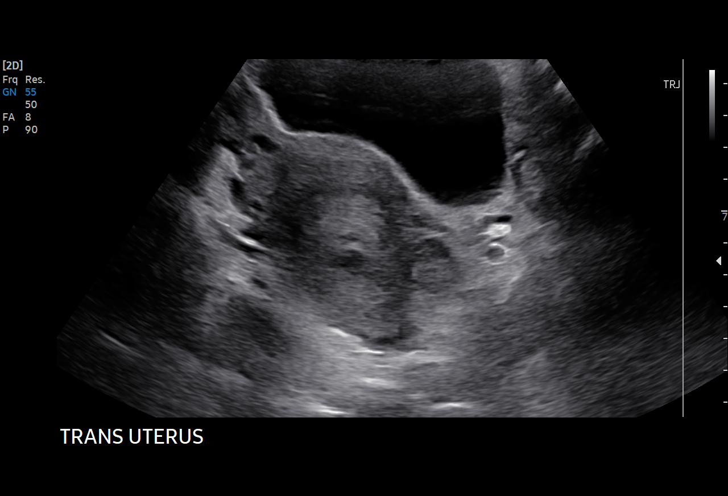
[im 24/91]
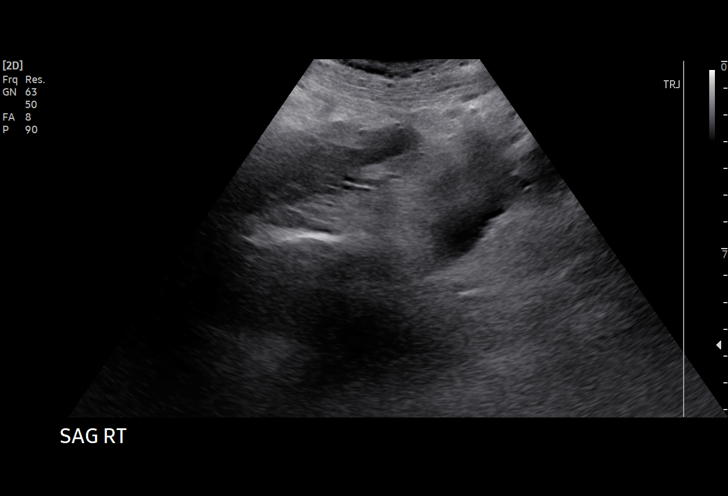
[im 31/91]
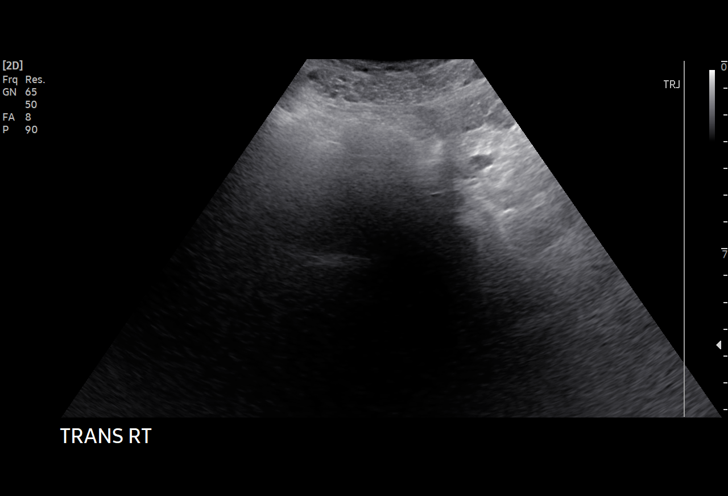
[im 37/91]
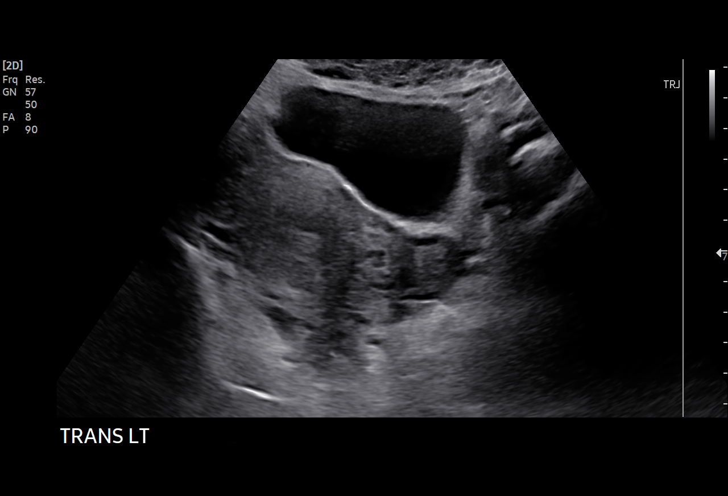
[im 44/91]
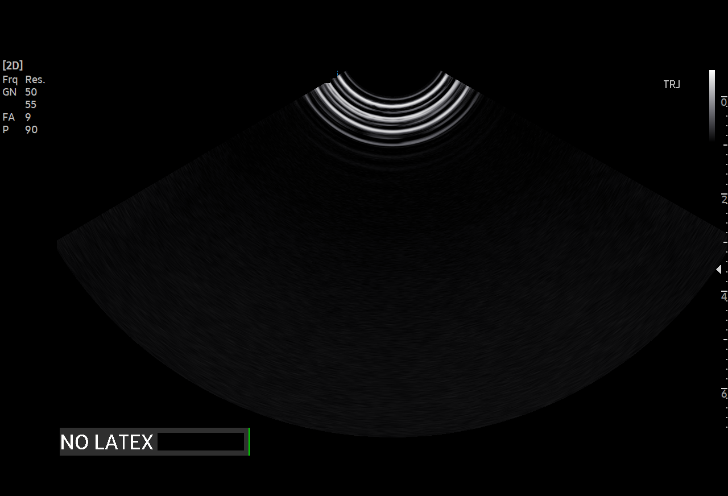
[im 51/91]
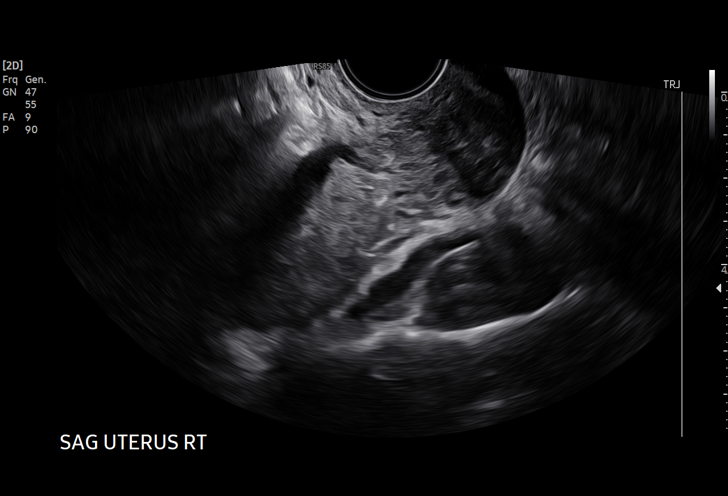
[im 57/91]
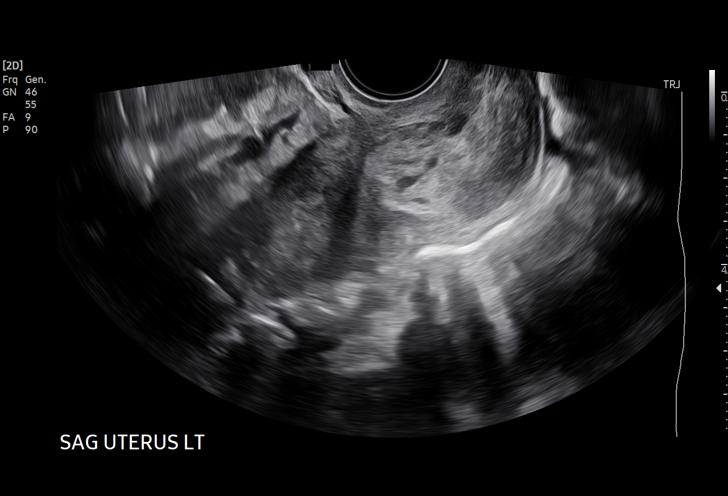
[im 64/91]
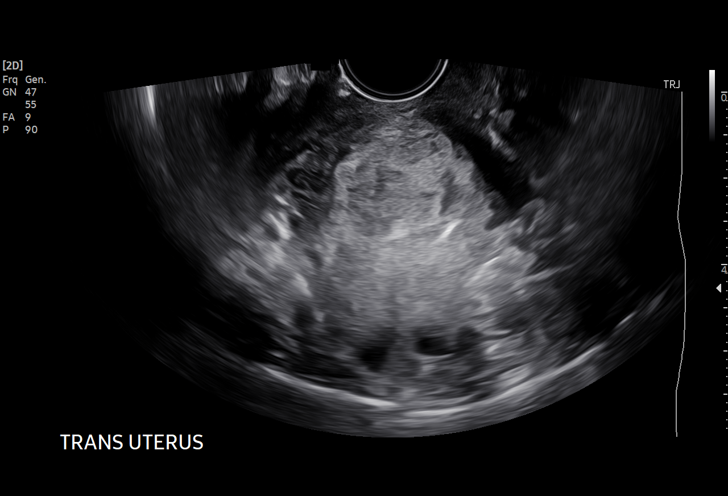
[im 71/91]
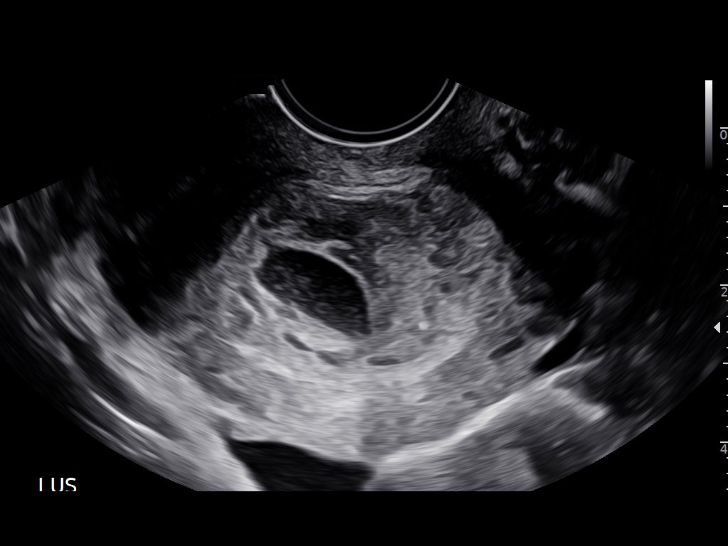
[im 77/91]
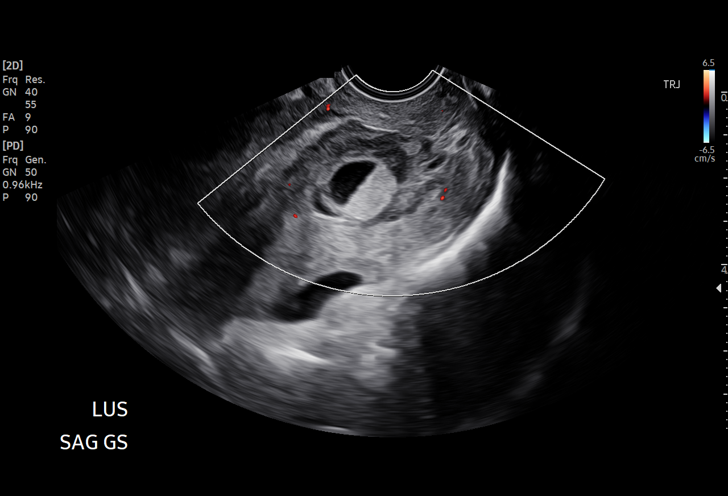
[im 84/91]
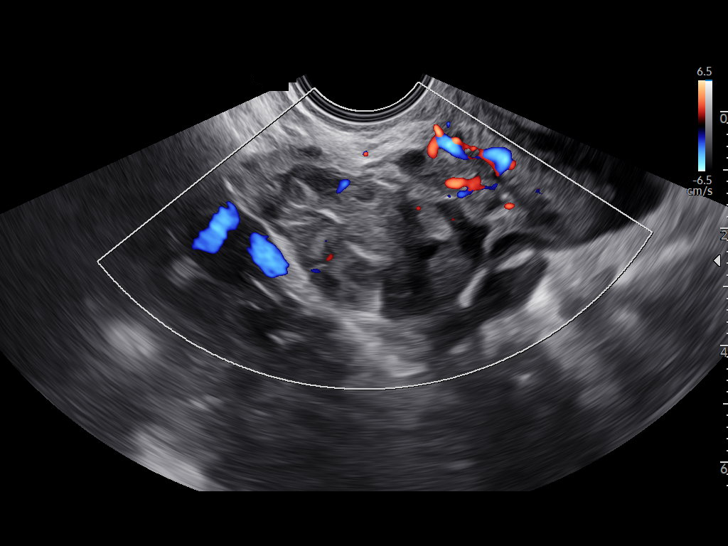
[im 91/91]
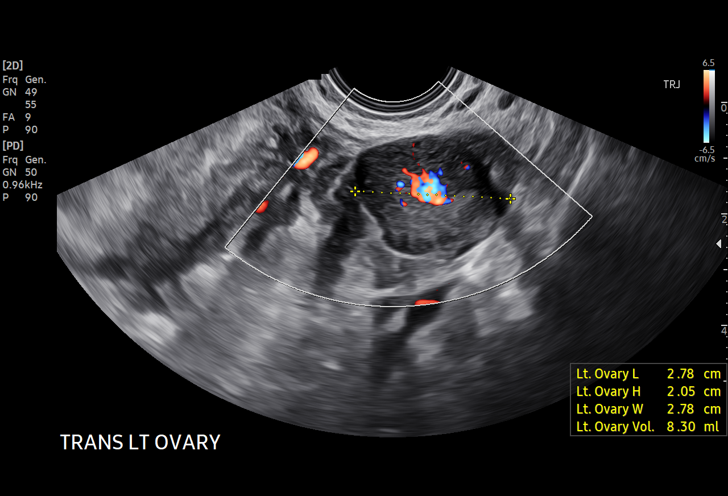

[14 of 28 positions shown; findings below may reference images not displayed]

FINDINGS: Intrauterine gestational sac: Single

Yolk sac:  Not Visualized.

Embryo:  Not Visualized.

Cardiac Activity: Not Visualized.

MSD: 13.1  mm   6 w   1  d

Subchorionic hemorrhage:  Small subchorionic hemorrhage.

Maternal uterus/adnexae: No adnexal mass. Echogenic material in the
lower uterine segment. Trace pelvic free fluid.
IMPRESSION: 1. A 13.1 mm gestational sac without a yolk sac, embryo or cardiac
activity. Findings are suspicious but not yet definitive for failed
pregnancy. Recommend follow-up US in 10-14 days for definitive
diagnosis. This recommendation follows SRU consensus guidelines:
Diagnostic Criteria for Nonviable Pregnancy Early in the First
Trimester. N Engl J Med 2301; [DATE].
FINDINGS: Intrauterine gestational sac: Single

Yolk sac:  Not Visualized.

Embryo:  Not Visualized.

Cardiac Activity: Not Visualized.

MSD: 13.1  mm   6 w   1  d

Subchorionic hemorrhage:  Small subchorionic hemorrhage.

Maternal uterus/adnexae: No adnexal mass. Echogenic material in the
lower uterine segment. Trace pelvic free fluid.
IMPRESSION: 1. A 13.1 mm gestational sac without a yolk sac, embryo or cardiac
activity. Findings are suspicious but not yet definitive for failed
pregnancy. Recommend follow-up US in 10-14 days for definitive
diagnosis. This recommendation follows SRU consensus guidelines:
Diagnostic Criteria for Nonviable Pregnancy Early in the First
Trimester. N Engl J Med 2301; [DATE].

## 2023-07-30 ENCOUNTER — Ambulatory Visit: Payer: Self-pay

## 2023-07-31 ENCOUNTER — Ambulatory Visit: Payer: No Typology Code available for payment source

## 2023-07-31 VITALS — BP 113/76 | HR 90 | Ht 61.0 in | Wt 137.4 lb

## 2023-07-31 DIAGNOSIS — Z3042 Encounter for surveillance of injectable contraceptive: Secondary | ICD-10-CM | POA: Diagnosis not present

## 2023-07-31 MED ORDER — MEDROXYPROGESTERONE ACETATE 150 MG/ML IM SUSP
150.0000 mg | Freq: Once | INTRAMUSCULAR | Status: AC
  Administered 2023-07-31: 150 mg via INTRAMUSCULAR

## 2023-07-31 NOTE — Progress Notes (Addendum)
    NURSE VISIT NOTE  Subjective:    Patient ID: Niamh Rada, female    DOB: [birth year], 33 y.o.   MRN: [id number]  HPI  Patient is a 33 y.o. [id number] female who presents for depo provera injection.   Objective:    BP 113/76   Pulse 90   Ht 5\' 1"  (1.549 m)   Wt 137 lb 6.4 oz (62.3 kg)   BMI 25.96 kg/m   Last Annual: 09/01/2022. Last pap: 02/28/2022. Last Depo-Provera: 05/13/2023. Side Effects if any: none. Serum HCG indicated? No . Depo-Provera 150 mg IM given by: Sheliah Hatch, CMA. Site: Right Deltoid  Lab Review    Assessment:   1. Encounter for surveillance of injectable contraceptive      Plan:   Next appointment due between 10/16/2023 and 10/30/2023.    Fonda Kinder, CMA

## 2023-07-31 NOTE — Patient Instructions (Signed)

## 2023-08-05 ENCOUNTER — Ambulatory Visit: Payer: Self-pay

## 2023-10-16 ENCOUNTER — Ambulatory Visit: Payer: No Typology Code available for payment source

## 2023-10-16 VITALS — BP 103/63 | HR 62 | Ht 61.0 in | Wt 140.8 lb

## 2023-10-16 DIAGNOSIS — Z3042 Encounter for surveillance of injectable contraceptive: Secondary | ICD-10-CM | POA: Diagnosis not present

## 2023-10-16 MED ORDER — MEDROXYPROGESTERONE ACETATE 150 MG/ML IM SUSY
150.0000 mg | PREFILLED_SYRINGE | Freq: Once | INTRAMUSCULAR | Status: AC
  Administered 2023-10-16: 150 mg via INTRAMUSCULAR

## 2023-10-16 NOTE — Progress Notes (Signed)
    NURSE VISIT NOTE  Subjective:    Patient ID: Michelle Miller, female    DOB: 06-Jan-1991, 33 y.o.   MRN: [id number]  HPI  Patient is a 33 y.o. [id number] female who presents for depo provera  injection.   Objective:    BP 103/63   Pulse 62   Ht 5\' 1"  (1.549 m)   Wt 140 lb 12.8 oz (63.9 kg)   BMI 26.60 kg/m   Last Annual: 09/01/2022. Last pap: 02/28/2022. Last Depo-Provera : 07/31/2023. Side Effects if any: none. Serum HCG indicated? No . Depo-Provera  150 mg IM given by: Liane Redman RN Site: Left Deltoid  Lab Review  @THIS  VISIT ONLY@  Assessment:   1. Encounter for surveillance of injectable contraceptive      Plan:   Next appointment due between 01/01/24 and 01/15/24.    Juanita Norlander, RN

## 2024-01-07 ENCOUNTER — Other Ambulatory Visit (HOSPITAL_COMMUNITY)
Admission: RE | Admit: 2024-01-07 | Discharge: 2024-01-07 | Disposition: A | Discharge status: Home / Self Care | Source: Ambulatory Visit | Attending: Obstetrics | Admitting: Obstetrics

## 2024-01-07 ENCOUNTER — Encounter: Payer: Self-pay | Admitting: Obstetrics

## 2024-01-07 ENCOUNTER — Ambulatory Visit: Admitting: Obstetrics

## 2024-01-07 VITALS — BP 127/74 | HR 97 | Wt 140.0 lb

## 2024-01-07 DIAGNOSIS — Z01419 Encounter for gynecological examination (general) (routine) without abnormal findings: Secondary | ICD-10-CM | POA: Diagnosis present

## 2024-01-07 DIAGNOSIS — Z3042 Encounter for surveillance of injectable contraceptive: Secondary | ICD-10-CM | POA: Diagnosis not present

## 2024-01-07 DIAGNOSIS — Z113 Encounter for screening for infections with a predominantly sexual mode of transmission: Secondary | ICD-10-CM | POA: Insufficient documentation

## 2024-01-07 LAB — CERVICOVAGINAL ANCILLARY ONLY
Chlamydia: NEGATIVE
Comment: NEGATIVE
Comment: NORMAL
Neisseria Gonorrhea: NEGATIVE

## 2024-01-07 MED ORDER — MEDROXYPROGESTERONE ACETATE 150 MG/ML IM SUSP
150.0000 mg | Freq: Once | INTRAMUSCULAR | Status: AC
  Administered 2024-01-07: 150 mg via INTRAMUSCULAR

## 2024-01-07 NOTE — Progress Notes (Signed)
 Last Annual: 01/06/24. Last pap: 02/28/22. Last Depo-Provera : 10/16/23. Side Effects if any: none. Serum HCG indicated? No . Depo-Provera  150 mg IM given by: Camelia Bars, LPN. Site: Right Deltoid Next appointment due 10/16 - 04/07/24 .

## 2024-01-07 NOTE — Patient Instructions (Signed)
 Preventive Care 31-31 Years Old, Female Preventive care refers to lifestyle choices and visits with your health care provider that can promote health and wellness. Preventive care visits are also called wellness exams. What can I expect for my preventive care visit? Counseling During your preventive care visit, your health care provider may ask about your: Medical history, including: Past medical problems. Family medical history. Pregnancy history. Current health, including: Menstrual cycle. Method of birth control. Emotional well-being. Home life and relationship well-being. Sexual activity and sexual health. Lifestyle, including: Alcohol, nicotine or tobacco, and drug use. Access to firearms. Diet, exercise, and sleep habits. Work and work Astronomer. Sunscreen use. Safety issues such as seatbelt and bike helmet use. Physical exam Your health care provider may check your: Height and weight. These may be used to calculate your BMI (body mass index). BMI is a measurement that tells if you are at a healthy weight. Waist circumference. This measures the distance around your waistline. This measurement also tells if you are at a healthy weight and may help predict your risk of certain diseases, such as type 2 diabetes and high blood pressure. Heart rate and blood pressure. Body temperature. Skin for abnormal spots. What immunizations do I need?  Vaccines are usually given at various ages, according to a schedule. Your health care provider will recommend vaccines for you based on your age, medical history, and lifestyle or other factors, such as travel or where you work. What tests do I need? Screening Your health care provider may recommend screening tests for certain conditions. This may include: Pelvic exam and Pap test. Lipid and cholesterol levels. Diabetes screening. This is done by checking your blood sugar (glucose) after you have not eaten for a while (fasting). Hepatitis  B test. Hepatitis C test. HIV (human immunodeficiency virus) test. STI (sexually transmitted infection) testing, if you are at risk. BRCA-related cancer screening. This may be done if you have a family history of breast, ovarian, tubal, or peritoneal cancers. Talk with your health care provider about your test results, treatment options, and if necessary, the need for more tests. Follow these instructions at home: Eating and drinking  Eat a healthy diet that includes fresh fruits and vegetables, whole grains, lean protein, and low-fat dairy products. Take vitamin and mineral supplements as recommended by your health care provider. Do not drink alcohol if: Your health care provider tells you not to drink. You are pregnant, may be pregnant, or are planning to become pregnant. If you drink alcohol: Limit how much you have to 0-1 drink a day. Know how much alcohol is in your drink. In the [place] one drink equals one 12 oz bottle of beer (355 mL), one 5 oz glass of wine (148 mL), or one 1 oz glass of hard liquor (44 mL). Lifestyle Brush your teeth every morning and night with fluoride toothpaste. Floss one time each day. Exercise for at least 30 minutes 5 or more days each week. Do not use any products that contain nicotine or tobacco. These products include cigarettes, chewing tobacco, and vaping devices, such as e-cigarettes. If you need help quitting, ask your health care provider. Do not use drugs. If you are sexually active, practice safe sex. Use a condom or other form of protection to prevent STIs. If you do not wish to become pregnant, use a form of birth control. If you plan to become pregnant, see your health care provider for a prepregnancy visit. Find healthy ways to manage stress, such as: Meditation,  yoga, or listening to music. Journaling. Talking to a trusted person. Spending time with friends and family. Minimize exposure to UV radiation to reduce your risk of skin  cancer. Safety Always wear your seat belt while driving or riding in a vehicle. Do not drive: If you have been drinking alcohol. Do not ride with someone who has been drinking. If you have been using any mind-altering substances or drugs. While texting. When you are tired or distracted. Wear a helmet and other protective equipment during sports activities. If you have firearms in your house, make sure you follow all gun safety procedures. Seek help if you have been physically or sexually abused. What's next? Go to your health care provider once a year for an annual wellness visit. Ask your health care provider how often you should have your eyes and teeth checked. Stay up to date on all vaccines. This information is not intended to replace advice given to you by your health care provider. Make sure you discuss any questions you have with your health care provider. Document Revised: 11/21/2020 Document Reviewed: 11/21/2020 Elsevier Patient Education  2024 Elsevier Inc.Breast Self-Awareness Breast self-awareness is knowing how your breasts look and feel. You need to: Check your breasts on a regular basis. Tell your doctor about any changes. Become familiar with the look and feel of your breasts. This can help you catch a breast problem while it is still small and can be treated. You should do breast self-exams even if you have breast implants. What you need: A mirror. A well-lit room. A pillow or other soft object. How to do a breast self-exam Follow these steps to do a breast self-exam: Look for changes  Take off all the clothes above your waist. Stand in front of a mirror in a room with good lighting. Put your hands down at your sides. Compare your breasts in the mirror. Look for any difference between them, such as: A difference in shape. A difference in size. Wrinkles, dips, and bumps in one breast and not the other. Look at each breast for changes in the skin, such  as: Redness. Scaly areas. Skin that has gotten thicker. Dimpling. Open sores (ulcers). Look for changes in your nipples, such as: Fluid coming out of a nipple. Fluid around a nipple. Bleeding. Dimpling. Redness. A nipple that looks pushed in (retracted), or that has changed position. Feel for changes Lie on your back. Feel each breast. To do this: Pick a breast to feel. Place a pillow under the shoulder closest to that breast. Put the arm closest to that breast behind your head. Feel the nipple area of that breast using the hand of your other arm. Feel the area with the pads of your three middle fingers by making small circles with your fingers. Use light, medium, and firm pressure. Continue the overlapping circles, moving downward over the breast. Keep making circles with your fingers. Stop when you feel your ribs. Start making circles with your fingers again, this time going upward until you reach your collarbone. Then, make circles outward across your breast and into your armpit area. Squeeze your nipple. Check for discharge and lumps. Repeat these steps to check your other breast. Sit or stand in the tub or shower. With soapy water on your skin, feel each breast the same way you did when you were lying down. Write down what you find Writing down what you find can help you remember what to tell your doctor. Write down: What is normal  for each breast. Any changes you find in each breast. These include: The kind of changes you find. A tender or painful breast. Any lump you find. Write down its size and where it is. When you last had your monthly period (menstrual cycle). General tips If you are breastfeeding, the best time to check your breasts is after you feed your baby or after you use a breast pump. If you get monthly bleeding, the best time to check your breasts is 5-7 days after your monthly cycle ends. With time, you will become comfortable with the self-exam. You will  also start to know if there are changes in your breasts. Contact a doctor if: You see a change in the shape or size of your breasts or nipples. You see a change in the skin of your breast or nipples, such as red or scaly skin. You have fluid coming from your nipples that is not normal. You find a new lump or thick area. You have breast pain. You have any concerns about your breast health. Summary Breast self-awareness includes looking for changes in your breasts and feeling for changes within your breasts. You should do breast self-awareness in front of a mirror in a well-lit room. If you get monthly periods (menstrual cycles), the best time to check your breasts is 5-7 days after your period ends. Tell your doctor about any changes you see in your breasts. Changes include changes in size, changes on the skin, painful or tender breasts, or fluid from your nipples that is not normal. This information is not intended to replace advice given to you by your health care provider. Make sure you discuss any questions you have with your health care provider. Document Revised: 10/31/2021 Document Reviewed: 03/28/2021 Elsevier Patient Education  2024 Elsevier Inc.Medroxyprogesterone  Injection (Contraception) What is this medication? MEDROXYPROGESTERONE  (me DROX ee proe JES te rone) prevents ovulation and pregnancy. It belongs to a group of medications called contraceptives. This medication is a progestin hormone. This medicine may be used for other purposes; ask your health care provider or pharmacist if you have questions. COMMON BRAND NAME(S): Depo-Provera , Depo-subQ Provera  104 What should I tell my care team before I take this medication? They need to know if you have any of these conditions: Asthma Blood clots Breast cancer or family history of breast cancer Depression Diabetes Eating disorder (anorexia nervosa) Frequently drink alcohol Heart attack High blood pressure HIV infection or  AIDS Kidney disease Liver disease Migraine headaches Osteoporosis, weak bones Seizures Stroke Tobacco use Vaginal bleeding An unusual or allergic reaction to medroxyprogesterone , other medications, foods, dyes, or preservatives Pregnant or trying to get pregnant Breast-feeding How should I use this medication? Depo-Provera  CI contraceptive injection is given into a muscle. Depo-subQ Provera  104 injection is given under the skin. It is given in a hospital or clinic setting. The injection is usually given during the first 5 days after the start of a menstrual period or 6 weeks after delivery of a baby. A patient package insert for the product will be given with each prescription and refill. Be sure to read this information carefully each time. The sheet may change often. Talk to your care team about the use of this medication in children. Special care may be needed. These injections have been used in female children who have started having menstrual periods. Overdosage: If you think you have taken too much of this medicine contact a poison control center or emergency room at once. NOTE: This medicine is only  for you. Do not share this medicine with others. What if I miss a dose? Keep appointments for follow-up doses. You must get an injection once every 3 months. It is important not to miss your dose. Call your care team if you are unable to keep an appointment. What may interact with this medication? Antibiotics or medications for infections, especially rifampin and griseofulvin Antivirals for HIV or hepatitis Aprepitant Armodafinil Bexarotene Bosentan Medications for seizures, such as carbamazepine, felbamate, oxcarbazepine, phenytoin, phenobarbital, primidone, topiramate Mitotane Modafinil St. John's Wort This list may not describe all possible interactions. Give your health care provider a list of all the medicines, herbs, non-prescription drugs, or dietary supplements you use. Also  tell them if you smoke, drink alcohol, or use illegal drugs. Some items may interact with your medicine. What should I watch for while using this medication? This medication does not protect you against HIV infection (AIDS) or other sexually transmitted diseases. Use of this product may cause you to lose calcium  from your bones. Loss of calcium  may cause weak bones (osteoporosis). Only use this product for more than 2 years if other forms of birth control are not right for you. The longer you use this product for birth control the more likely you will be at risk for weak bones. Ask your care team how you can keep strong bones. You may have a change in bleeding pattern or irregular periods. Many females stop having periods while taking this medication. If you have received your injections on time, your chance of being pregnant is very low. If you think you may be pregnant, see your care team as soon as possible. Tell your care team if you want to get pregnant within the next year. The effect of this medication may last a long time after you get your last injection. What side effects may I notice from receiving this medication? Side effects that you should report to your care team as soon as possible: Allergic reactions--skin rash, itching, hives, swelling of the face, lips, tongue, or throat Blood clot--pain, swelling, or warmth in the leg, shortness of breath, chest pain Gallbladder problems--severe stomach pain, nausea, vomiting, fever Increase in blood pressure Liver injury--right upper belly pain, loss of appetite, nausea, light-colored stool, dark yellow or brown urine, yellowing skin or eyes, unusual weakness or fatigue New or worsening migraines or headaches Seizures Stroke--sudden numbness or weakness of the face, arm, or leg, trouble speaking, confusion, trouble walking, loss of balance or coordination, dizziness, severe headache, change in vision Unusual vaginal discharge, itching, or  odor Worsening mood, feelings of depression Side effects that usually do not require medical attention (report to your care team if they continue or are bothersome): Breast pain or tenderness Dark patches of the skin on the face or other sun-exposed areas Irregular menstrual cycles or spotting Nausea Weight gain This list may not describe all possible side effects. Call your doctor for medical advice about side effects. You may report side effects to FDA at 1-800-FDA-1088. Where should I keep my medication? This injection is only given by a care team. It will not be stored at home. NOTE: This sheet is a summary. It may not cover all possible information. If you have questions about this medicine, talk to your doctor, pharmacist, or health care provider.  2024 Elsevier/Gold Standard (2021-02-06 00:00:00)

## 2024-01-07 NOTE — Progress Notes (Signed)
 ANNUAL GYNECOLOGICAL EXAM  SUBJECTIVE  HPI  Michelle Miller is a 33 y.o.-year-old [id number] who presents for an annual gynecological exam today.  She denies pelvic pain, dyspareunia, abnormal vaginal bleeding or discharge, and UTI symptoms. She is sexually active with a female partner. She uses Depo for contraception and is happy with this method. 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   Supervision of high risk pregnancy, antepartum 01/31/2022          Nursing Staff  Provider  Office Location   [facility]  Dating   LMP 12/13/2021  Language   English  Anatomy US       Flu Vaccine   Declined   Genetic Screen   NIPS: Declined  TDaP vaccine      Hgb A1C or   GTT  Early :  Third trimester :   Covid        LAB RESULTS   Rhogam      Blood Type  AB/Positive/-- (09/14 1334)   Feeding Plan  Breast  Antibody  Negative (09/14 1334)  Contraception     Rube   Past Surgical History:  Procedure Laterality Date   CERVICAL CERCLAGE N/A 03/28/2022   Procedure: CERCLAGE CERVICAL;  Surgeon: Connell Davies, MD;  Location: [facility] ORS;  Service: Gynecology;  Laterality: N/A;   TONSILLECTOMY     age 24    Social History Lives with mother and 2 kids. Feels safe there Work: works from home, [employer] Exercise: home workouts, running with kids Substances: Occasional EtOH. Denies tobacco, vape, and recreational drugs  Obstetric History OB History     Gravida  4   Para  3   Term  1   Preterm  2   AB  1   Living  2      SAB  1   IAB  0   Ectopic  0   Multiple  0   Live Births  2            GYN/Menstrual History No LMP recorded. Patient has had an injection. Last Pap:  02/2022 Contraception: Depo  Prevention Dentist: has not been in a while Eye exam: q 3 years Mammogram: at 42 Colonoscopy: at 47  Current  Medications No outpatient medications prior to visit.   No facility-administered medications prior to visit.      [facility]stream - 01/07/24 9166       Pregnancy Intention Screening   Does the patient want to become pregnant in the next year? No    Does the patient's partner want to become pregnant in the next year? No    Would the patient like to discuss contraceptive options today? No      Contraception Wrap Up   Current Method Hormonal Injection    End Method Hormonal Injection    Contraception Counseling Provided No    How was the end contraceptive method provided? N/A           ROS Constitutional: Denied constitutional symptoms, night sweats, recent illness, fatigue, fever, insomnia and weight loss.  Eyes: Denied eye symptoms, eye pain, photophobia, vision change and visual disturbance.  Ears/Nose/Throat/Neck: Denied ear, nose, throat or neck symptoms, hearing loss, nasal discharge, sinus congestion and sore throat.  Cardiovascular: Denied cardiovascular symptoms, arrhythmia, chest pain/pressure, edema, exercise  intolerance, orthopnea and palpitations.  Respiratory: Denied pulmonary symptoms, asthma, pleuritic pain, productive sputum, cough, dyspnea and wheezing.  Gastrointestinal: Denied gastro-esophageal reflux, melena, nausea and vomiting.  Genitourinary: Denied genitourinary symptoms including symptomatic vaginal discharge, pelvic relaxation issues, and urinary complaints.  Musculoskeletal: Denied musculoskeletal symptoms, stiffness, swelling, muscle weakness and myalgia.  Dermatologic: Denied dermatology symptoms, rash and scar.  Neurologic: Denied neurology symptoms, dizziness, headache, neck pain and syncope.  Psychiatric: Denied psychiatric symptoms, anxiety and depression.  Endocrine: Denied endocrine symptoms including hot flashes and night sweats.    OBJECTIVE  Wt 140 lb (63.5 kg)   Breastfeeding No   BMI 26.45 kg/m    Physical examination General NAD,  Conversant  HEENT Atraumatic; Op clear with mmm.  Normo-cephalic. Pupils reactive. Anicteric sclerae  Thyroid/Neck Smooth without nodularity or enlargement. Normal ROM.  Neck Supple.  Skin No rashes, lesions or ulceration. Normal palpated skin turgor. No nodularity.  Breasts: No masses or discharge.  Symmetric.  No axillary adenopathy.  Lungs: Clear to auscultation.No rales or wheezes. Normal Respiratory effort, no retractions.  Heart: NSR.  No murmurs or rubs appreciated. No peripheral edema  Abdomen: Soft.  Non-tender.  No masses.  No HSM. No hernia  Extremities: Moves all appropriately.  Normal ROM for age. No lymphadenopathy.  Neuro: Oriented to PPT.  Normal mood. Normal affect.     Pelvic: Declined    ASSESSMENT  1) Annual exam 2) Due for Depo   PLAN 1) Physical exam as noted. Discussed healthy lifestyle choices and preventive care. STI swabs collected. Declines blood work and routine labs. Pap due 02/2025. 2) Depo injection given  Return q 12 weeks for Depo injection or as needed for concerns.  Annual exam with Pap in one year.    Randy Whitener, CNM

## 2024-01-08 ENCOUNTER — Encounter

## 2024-01-08 ENCOUNTER — Ambulatory Visit

## 2024-03-29 ENCOUNTER — Ambulatory Visit

## 2024-03-29 VITALS — BP 120/77 | HR 88 | Ht 61.0 in | Wt 138.7 lb

## 2024-03-29 DIAGNOSIS — Z3042 Encounter for surveillance of injectable contraceptive: Secondary | ICD-10-CM

## 2024-03-29 DIAGNOSIS — Z3202 Encounter for pregnancy test, result negative: Secondary | ICD-10-CM | POA: Diagnosis not present

## 2024-03-29 LAB — POCT URINE PREGNANCY: Preg Test, Ur: NEGATIVE

## 2024-03-29 MED ORDER — MEDROXYPROGESTERONE ACETATE 150 MG/ML IM SUSP
150.0000 mg | Freq: Once | INTRAMUSCULAR | Status: AC
  Administered 2024-03-29: 150 mg via INTRAMUSCULAR

## 2024-03-29 NOTE — Progress Notes (Signed)
    NURSE VISIT NOTE  Subjective:    Patient ID: Michelle Miller, female    DOB: 1991/01/08, 33 y.o.   MRN: [id number]  HPI  Patient is a 33 y.o. [id number] female who presents for depo provera  injection.   Objective:    BP 120/77   Pulse 88   Ht 5' 1 (1.549 m)   Wt 138 lb 11.2 oz (62.9 kg)   LMP  (LMP Unknown)   Breastfeeding No   BMI 26.21 kg/m   Last Annual: 01/07/24. Last pap: 02/29/24. Last Depo-Provera : 10/16/23. Side Effects if any: none. Serum HCG indicated? No . Depo-Provera  150 mg IM given by: Mathis Getting, CMA. Site: Right Deltoid  Lab Review  Results for orders placed or performed in visit on 03/29/24  POCT urine pregnancy  Result Value Ref Range   Preg Test, Ur Negative Negative    Assessment:   1. Encounter for surveillance of injectable contraceptive      Plan:   Next appointment due between 06/14/24 and 06/28/24.    Mathis LITTIE Getting, CMA

## 2024-03-29 NOTE — Patient Instructions (Signed)

## 2024-06-13 NOTE — Patient Instructions (Signed)

## 2024-06-13 NOTE — Progress Notes (Signed)
" ° ° °  NURSE VISIT NOTE  Subjective:    Patient ID: Michelle Miller, female    DOB: 1991-06-07, 34 y.o.   MRN: [id number]  HPI  Patient is a 34 y.o. [id number] female who presents for depo provera  injection.   Objective:    BP 107/69   Pulse 64   Ht 5' 2 (1.575 m) Comment: patient reports  Wt 138 lb 11.2 oz (62.9 kg)   BMI 25.37 kg/m   Last Annual: 01/07/24. Last pap: 02/28/22. Last Depo-Provera : 03/29/24. Side Effects if any: none. Serum HCG indicated? No . Depo-Provera  150 mg IM given by: [name], CMA. Site: Left Deltoid  Lab Review  No results found for any visits on 06/14/24.  Assessment:   1. Encounter for surveillance of injectable contraceptive      Plan:   Next appointment due between March 24 and April 7,2026.    Rollo JINNY Maxin, CMA  "

## 2024-06-14 ENCOUNTER — Ambulatory Visit

## 2024-06-14 VITALS — BP 107/69 | HR 64 | Ht 62.0 in | Wt 138.7 lb

## 2024-06-14 DIAGNOSIS — Z3042 Encounter for surveillance of injectable contraceptive: Secondary | ICD-10-CM | POA: Diagnosis not present

## 2024-06-14 MED ORDER — MEDROXYPROGESTERONE ACETATE 150 MG/ML IM SUSP
150.0000 mg | Freq: Once | INTRAMUSCULAR | Status: AC
  Administered 2024-06-14: 150 mg via INTRAMUSCULAR

## 2024-09-06 ENCOUNTER — Ambulatory Visit
